# Patient Record
Sex: Female | Born: 1960
Health system: Southern US, Community
[De-identification: ages and names within clinical notes are randomized; demographics above are authoritative.]

## PROBLEM LIST (undated history)

## (undated) DIAGNOSIS — G4700 Insomnia, unspecified: Secondary | ICD-10-CM

## (undated) DIAGNOSIS — Q513 Bicornate uterus: Secondary | ICD-10-CM

## (undated) DIAGNOSIS — C4490 Unspecified malignant neoplasm of skin, unspecified: Secondary | ICD-10-CM

## (undated) DIAGNOSIS — R519 Headache, unspecified: Secondary | ICD-10-CM

## (undated) DIAGNOSIS — Z85820 Personal history of malignant melanoma of skin: Secondary | ICD-10-CM

## (undated) DIAGNOSIS — H9190 Unspecified hearing loss, unspecified ear: Secondary | ICD-10-CM

## (undated) DIAGNOSIS — F419 Anxiety disorder, unspecified: Secondary | ICD-10-CM

## (undated) DIAGNOSIS — M1990 Unspecified osteoarthritis, unspecified site: Secondary | ICD-10-CM

## (undated) DIAGNOSIS — Z803 Family history of malignant neoplasm of breast: Secondary | ICD-10-CM

## (undated) DIAGNOSIS — R51 Headache: Secondary | ICD-10-CM

## (undated) DIAGNOSIS — C439 Malignant melanoma of skin, unspecified: Secondary | ICD-10-CM

## (undated) DIAGNOSIS — D649 Anemia, unspecified: Secondary | ICD-10-CM

## (undated) HISTORY — DX: Headache: R51

## (undated) HISTORY — DX: Headache, unspecified: R51.9

## (undated) HISTORY — DX: Personal history of malignant melanoma of skin: Z85.820

## (undated) HISTORY — DX: Anemia, unspecified: D64.9

## (undated) HISTORY — DX: Family history of malignant neoplasm of breast: Z80.3

## (undated) HISTORY — DX: Unspecified malignant neoplasm of skin, unspecified: C44.90

## (undated) HISTORY — PX: RADIOFREQUENCY ABLATION: SHX2290

## (undated) HISTORY — PX: DILATION AND CURETTAGE OF UTERUS: SHX78

## (undated) HISTORY — DX: Bicornate uterus: Q51.3

## (undated) HISTORY — DX: Malignant melanoma of skin, unspecified: C43.9

## (undated) HISTORY — DX: Insomnia, unspecified: G47.00

## (undated) HISTORY — PX: SKIN BIOPSY: SHX1

---

## 2011-10-31 ENCOUNTER — Other Ambulatory Visit: Payer: Self-pay | Admitting: Obstetrics & Gynecology

## 2011-10-31 DIAGNOSIS — Z1231 Encounter for screening mammogram for malignant neoplasm of breast: Secondary | ICD-10-CM

## 2011-11-26 ENCOUNTER — Ambulatory Visit
Admission: RE | Admit: 2011-11-26 | Discharge: 2011-11-26 | Disposition: A | Discharge status: Home / Self Care | Payer: BC Managed Care – PPO | Source: Ambulatory Visit | Attending: Obstetrics & Gynecology | Admitting: Obstetrics & Gynecology

## 2011-11-26 DIAGNOSIS — Z1231 Encounter for screening mammogram for malignant neoplasm of breast: Secondary | ICD-10-CM

## 2012-10-19 ENCOUNTER — Other Ambulatory Visit: Payer: Self-pay | Admitting: Obstetrics & Gynecology

## 2012-10-19 DIAGNOSIS — Z1231 Encounter for screening mammogram for malignant neoplasm of breast: Secondary | ICD-10-CM

## 2012-11-24 ENCOUNTER — Ambulatory Visit
Admission: RE | Admit: 2012-11-24 | Discharge: 2012-11-24 | Disposition: A | Discharge status: Home / Self Care | Payer: BC Managed Care – PPO | Source: Ambulatory Visit | Attending: Obstetrics & Gynecology | Admitting: Obstetrics & Gynecology

## 2012-11-24 DIAGNOSIS — Z1231 Encounter for screening mammogram for malignant neoplasm of breast: Secondary | ICD-10-CM

## 2012-11-26 ENCOUNTER — Ambulatory Visit: Payer: BC Managed Care – PPO

## 2013-04-29 ENCOUNTER — Ambulatory Visit: Payer: Self-pay | Admitting: Obstetrics & Gynecology

## 2013-05-04 ENCOUNTER — Encounter: Payer: Self-pay | Admitting: *Deleted

## 2013-05-06 ENCOUNTER — Encounter: Payer: Self-pay | Admitting: Obstetrics & Gynecology

## 2013-05-06 ENCOUNTER — Ambulatory Visit (INDEPENDENT_AMBULATORY_CARE_PROVIDER_SITE_OTHER): Payer: BC Managed Care – PPO | Admitting: Obstetrics & Gynecology

## 2013-05-06 VITALS — BP 100/64 | HR 64 | Resp 16

## 2013-05-06 DIAGNOSIS — N9489 Other specified conditions associated with female genital organs and menstrual cycle: Secondary | ICD-10-CM

## 2013-05-06 DIAGNOSIS — IMO0002 Reserved for concepts with insufficient information to code with codable children: Secondary | ICD-10-CM

## 2013-05-06 NOTE — Progress Notes (Signed)
52 y.o. Married Caucasian female [phone] here for complaint of pelvic pain and irregular.  Using vagifem but still not very sexually active.  Lots of stress because her mother needing a lot of care.  Mother lives in [place], [place], and she fell and broke both her hip and shattered her shoulder.  She was in rehab for 25 days.  Sex is still painful.  She does feel lubricated.  She feels like there is something obstructing adequate insertion.  She does not have pain with insertions.  She has been using the vagifem faithfully and feels no improvement.  She still wonders if the fibroid is causing her the pain.  I feel like that is less likely because of location.  She does have urinary frequency and occasional urgency.  No hematuria.  Cycles are irregular as well.  She brought a calendar with her.  Bleeding in February was from 25-26 only and was light.  In March, bleeding was from 16-20 with three heavier days.  Bleeding in April was from 25-29 and again there were three heavier days.  Bleeding in may was 15-21 with several additional days of spotting.  She is not having hot flashes or night sweats.  When she has heavy bleeding, she needs to change products about every three hours.  She does have occasional clotting.    ROS:  All other ROS questions are negative except as per HPI.  Exam:   BP 100/64  Pulse 64  Resp 16  LMP 04/29/2013 General appearance: alert and cooperative Abdomen:  soft, non-tender; bowel sounds normal; no masses,  no organomegaly Lymph:  Inguinal adenopathy: negative   Pelvic: External genitalia:  no lesions              Urethra: normal appearing urethra with no masses, tenderness or lesions              Bartholins and Skenes: normal                 Vagina: normal appearing vagina with normal color and discharge, no lesions, improved moisture              Cervix: normal appearance and without lesions, No CMT              Pap taken: yes Bimanual Exam:  Uterus:  enlarged to 10  week's size with anterior nodularity, pain is present with bimanual exam but is very non-specific                               Adnexa:    normal adnexa in size, nontender and no masses                               Rectovaginal: Deferred                               Anus:  No skin changes  Discussion:  Patient and I had lengthy discussion about her physical exam and symptoms.  I really did feel perimenopausal changes were contributing when we first discussed this issue.  Patient is not the best historian at times and, at first, she did not follow my recommendations about estrogen use.  Also, I did not realize that she had been experiencing these issues for so long as she did not initially describe  them as such.  On physical exam, she does have an enlarged uterus and she does have a fibroid but fibroids usually cause bleeding issues, primarily.  She does not have pelvic floor pain.  She has common urinary issues for woman with prior pregnancies and now in her 61's.  She feels there is something obstructing adequate insertion of spouse which may be due to very anteflexed uterus and some mild prolapse.  Causes of pelvic pain and dyspareunia discussed.  Complete evaluation with cystoscopy and laparoscopy discussed.  I have a low suspicion for I.C.  Still, I think this evaluation would be helpful for her in deciding if she ultimately wants to proceed with hysterectomy.  I am unsure if hysterectomy will give her resolution in pain and I want her to be satisfied with results of any procedure we might do.  She would like to pursue cystoscopy and laparoscopy.  Knows she will need to see urology.  All questions answered.  A: Dyspareunia, pelvic pain  P: Refer to Dr. Lorayne Marek to plan cystoscopy and laparoscopy at same time to r/o Interstitial cystitis and endometriosis.  If these evaluations are negative, will consider hysterectomy.  She is not sure she wants to pursue more aggressive surgery and I have been very  clear that I cannot guarantee resolution of pain even if she has a hysterectomy.  She would like to be sexually active with her spouse so she will seriously consider our discussion.        ~30 minutes spent with patient >50% of time was in face to face discussion of above.   An After Visit Summary was printed and given to the patient.

## 2013-05-06 NOTE — Patient Instructions (Signed)
We will call with your referral.

## 2013-05-12 ENCOUNTER — Encounter: Payer: Self-pay | Admitting: Obstetrics & Gynecology

## 2013-05-12 NOTE — Addendum Note (Signed)
Addended by: Jerene Bears on: 05/12/2013 03:21 PM   Modules accepted: Orders

## 2013-05-14 ENCOUNTER — Telehealth: Payer: Self-pay | Admitting: *Deleted

## 2013-05-14 NOTE — Telephone Encounter (Signed)
Spoke with patient regarding referral process and possibility that with limited availability of provider (MacDiarmid) and he is the only provider that can offer this evaluation in this area, may have to be flexible with scheduling or be willing to wait.  Also discussed that surgery scheduling with two physicians from different practices requires coordination and time and will not likely be within the next 2-3 weeks as patient had hoped.  Both she and I have talked with two different triage nurses who will check with Dr Sherron Monday on Monday to see if she can be worked in with her limited schedule times.  Patient is requesting a Friday surgery date and to go ahead and schedule it even before she see's Dr Sherron Monday.  I advised that we can often "hold" time based on pending appointments and i will try to do this but that I do not know the availability of Dr Sherron Monday  on a Friday. Dr Hyacinth Meeker is making an exception to do surgery on a Friday but i cant promise that Friday is an option with his office.  Brief discussion of limitations following surgery discussed.  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 Patieint aware I will have limited availability on Mon and Tues next week but she is to call MacDiarmid's office at 10 on Monday to see about work in appointment and she will let me know when that is and we will try to proceed with scheduling case.

## 2013-05-14 NOTE — Telephone Encounter (Signed)
Patient called to confirm if appointment has been made for the referral to urologist. Nurse called Dr. Sherron Monday office and spoke to scheduler who stated patient had appt. For June 26th @ 2:45pm. Gave message to patient and she stated this was not a good date or time frame for her. Explained to patient that next available appt. For her with Dr. Gildardo Griffes Diarmid would be July 9th. Patient upset at this time and asked for Dr. Hyacinth Meeker or our office to call and explain she needed appt. Sooner in the time frame she wanted.

## 2013-05-18 NOTE — Telephone Encounter (Signed)
Call to pam at Dr Sherron Monday office to coordinate surgery date and check on Fri availability.  They have moved her appt up to 05-18-13 at 1000. She will call us back with date options after talking to Dr Sherron Monday.

## 2013-05-19 ENCOUNTER — Other Ambulatory Visit: Payer: Self-pay | Admitting: Urology

## 2013-05-19 NOTE — Telephone Encounter (Signed)
Calls from [facility] at dr Gildardo Griffes Diarmid and patient on two separate lines, both regarding scheduling.  Patient voiced frustration to frony desk that she was not able to speak with me immediately.  Per Pam, patient has given chice of dates and Dr Sherron Monday did agree to a Friday.  Patient has insisted to [facility] that she will be driving home after surgery and or take a cab and she stares that hospital will not know if she does this.  She also states she has done it before.  Pam has advised her of policy.  Dates from [facility] reviewed, will post case and call her back.

## 2013-05-19 NOTE — Telephone Encounter (Signed)
Please call her before calling Pam to scheduling her appointment.

## 2013-05-20 NOTE — Telephone Encounter (Signed)
Please call patient she has concerns about surgery and blood clotts

## 2013-05-21 NOTE — Telephone Encounter (Signed)
No answer and no VM

## 2013-05-21 NOTE — Telephone Encounter (Signed)
Line busy

## 2013-05-21 NOTE — Telephone Encounter (Signed)
Returning call to patient. She had questions about "clots".  She states she is letting Dr Hyacinth Meeker know that her most recent cycle has had clots that patient has previously reported she did not have. She also wants Dr Hyacinth Meeker to know that after being off Estrogen for approx 2 weeks, she does feel like bloating has improved.   Additionally, she expressed frustration regarding office policy regarding payment in full of estimated portion 2 weeks prior to procedure.  Husband is having MRI with multiple other tests on 07-09-13 and this will likely meet her total family deductible.  She will then be stuck waiting for [place] to refund her. Feels we are not listening to her and her situation and that she has never been asked to pay for procedures up front at any other practice.  Requests to speak with Dr Hyacinth Meeker directly. Feels that what she was told by administration regarding our payment policy conflicts with how dr Hyacinth Meeker dares for patients.  Advised patient that with changes in healthcare, it is our office policy, and many others as well,  to collect up front for procedures and surgery but we pride ourself in being a practice that looks at each situation. Reassured patient that we can have insurance rechecked closer to surgery date and work out a mutual payment arrangement.    Due to patient's frustration, she is aware she can call and ask for me directly.

## 2013-05-21 NOTE — Telephone Encounter (Signed)
Late note.  Spoke to patient on 05-19-13 to notify of surgery date and time.  Laparoscopy (and Cysto with HOD with Dr Sherron Monday) at 0730 on Friday, 07-23-13 at [facility] hosp. Total of 35 mins on phone to review surgical instructions, appt date and times for pre and post op appointments.  Stressed hospital policy that she must have an adult driver to take her home and to have someone with her that day.  (patient wanted to allow 54 year old son who will be licensed by then) Discussed general anesthesia and therefore no driving for 24 hours.  Small abdominal incision and anesthesia will have an effect on activity level the following day.  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 Discussed this is possible with limitations. Written surgical instructions mailed to patient.

## 2013-06-03 ENCOUNTER — Telehealth: Payer: Self-pay | Admitting: Obstetrics & Gynecology

## 2013-06-03 NOTE — Telephone Encounter (Signed)
Called patient to advise her of her OOP for her upcoming surgery. Patient insists that I need to talk to Kennon Rounds and to let [place] handle all of this because the amount I told her is meaningless. She insists that She will not be paying this much because of other procedures her husband has scheduled. I assured her that I would recheck her benefits closer to her surgery date to see if the amount she owes has decreased. The patient is very confused on how we got the amount she will owe and why I was calling her instead of Kennon Rounds. She decided that she no longer wanted to talk to me about this and asked to speak with Kennon Rounds. I put her through to [employer]

## 2013-06-29 NOTE — Telephone Encounter (Signed)
Patient is asking to talk with Kennon Rounds, no details given. Patient said she called last week and no one has called her back.

## 2013-06-30 NOTE — Telephone Encounter (Signed)
Patient notified of Dr Romine's instruction and she would like to pick up the samples.  Will come by office tomorrow.

## 2013-06-30 NOTE — Telephone Encounter (Signed)
Nothing to do about the bleeding.  For migraine, use celebrex - if we have some, you could give her three caps of the 200 mg to take if she needs it.  If not, can Rx them, 200 mg # 9 1 po q 12 hrs prn headache.

## 2013-06-30 NOTE — Telephone Encounter (Signed)
Patient calling to ask several questions of Dr Hyacinth Meeker .  1)  Reports LMP 06-10-13 and started with usual heaviness but never fully ended.  Continued to have spotting until yesterday.  Anything else to do? Husband with vasectomy. 2)  What to do for Migraines during the two weeks with no Motrin or Excedrin Migraine.  She usually needs these medications 2-3 times per week and Tylenol does not help. She will also be traveling during this time. 3)  Requested name of surgical procedure and review of OTC meds she cant take for the two weeks prior.  I answered this part myself.

## 2013-07-01 NOTE — Telephone Encounter (Signed)
Patient came to office and picked up sample , Celebrex 200mg  2 cards/ 3 tabs each/ lot number [id number] exp Dec 2016 given to patient. Instructed may take one tab Q12 hrs prn headache per Dr Tresa Res.

## 2013-07-12 ENCOUNTER — Telehealth: Payer: Self-pay | Admitting: Obstetrics & Gynecology

## 2013-07-12 NOTE — Telephone Encounter (Signed)
Call from patient who states she has a sever headache that has been unresponsive to ES Tylenol and Celebrex.  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  Took ES Tylenol at 0915 and Celebrex at 215 with no relief.  Excedrin migraine and rest is quiet room always works for her but cant take it due to surgery planned for 07-23-13.  Discussed to take Excedrin migraine (ok per CR) and will check with Dr Hyacinth Meeker on what next step should be.  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

## 2013-07-12 NOTE — Telephone Encounter (Signed)
She cannot take the excedrin migraine 6 days before surgery.  None.  Or surgery will be cancelled.  Has she ever taken any other migraine medications?  Imitrex, Relpax, etc?

## 2013-07-13 NOTE — Telephone Encounter (Signed)
It's all individual about migraine meds.  They may make her sleepy too.  Prescribe Imitrex 100mg  po x 1, repeat 2 hrs if not fully resolved.

## 2013-07-13 NOTE — Telephone Encounter (Signed)
Patient notified of dr Rondel Baton response and that she must not take the Excedrin Migraine for 6 full days preop or surgery would have to be canceled.  She has not taken any migraine medications before.  Only ever triedTylenol, Motrin and Excedrin migraine. She reports her daughter has had Imitrex prescribed in the past but was told it didn't work if wasn't taken in first 30 minutes.  Discussed that it is generally more effective if taken at onset but not necessarily that it wouldn't work.  She really does not want to cancel surgery but states she will need something to help HA during that 6 days as headaches are "debilitataing".  She is willing to try migraine med.  Would like the one that "works best".

## 2013-07-14 MED ORDER — SUMATRIPTAN SUCCINATE 100 MG PO TABS: ORAL_TABLET | ORAL | Status: DC

## 2013-07-14 NOTE — Telephone Encounter (Signed)
Yet message for you to call her back. [phone]

## 2013-07-14 NOTE — Telephone Encounter (Signed)
Rx sent th to pharmacy. Call to patient and LMTCB.

## 2013-07-14 NOTE — Telephone Encounter (Signed)
Patient returning Sally's call. °

## 2013-07-14 NOTE — Telephone Encounter (Signed)
Returning call to patient, RX has been sent. LMTCB if needed.

## 2013-07-14 NOTE — Telephone Encounter (Signed)
LM on VM that confirms patient's name, LM rx has been sent and call if questions.

## 2013-07-15 ENCOUNTER — Ambulatory Visit (INDEPENDENT_AMBULATORY_CARE_PROVIDER_SITE_OTHER): Payer: BC Managed Care – PPO | Admitting: Obstetrics & Gynecology

## 2013-07-15 ENCOUNTER — Encounter: Payer: Self-pay | Admitting: Obstetrics & Gynecology

## 2013-07-15 ENCOUNTER — Ambulatory Visit: Payer: Self-pay | Admitting: Obstetrics & Gynecology

## 2013-07-15 VITALS — BP 100/62 | HR 68 | Resp 16 | Ht 68.25 in | Wt 192.0 lb

## 2013-07-15 DIAGNOSIS — N9489 Other specified conditions associated with female genital organs and menstrual cycle: Secondary | ICD-10-CM

## 2013-07-15 DIAGNOSIS — IMO0002 Reserved for concepts with insufficient information to code with codable children: Secondary | ICD-10-CM

## 2013-07-15 MED ORDER — ALPRAZOLAM 0.5 MG PO TABS: 0.5000 mg | ORAL_TABLET | Freq: Every evening | ORAL | Status: DC | PRN

## 2013-07-15 MED ORDER — HYDROCODONE-ACETAMINOPHEN 5-325 MG PO TABS: 1.0000 | ORAL_TABLET | Freq: Four times a day (QID) | ORAL | Status: DC | PRN

## 2013-07-15 NOTE — Progress Notes (Signed)
52 y.o. [id number] MarriedCaucasian female here for discussion of upcoming procedure.  Laparoscopy and planned due to 07/23/13 due to hx of pelvic pain, dyspareunia, and to rule out IC.  Pre-op evaluation thus far has included consultation with Dr. Sherron Monday.  Patient is very anxious about this procedure.  She is afraid about dying during surgery.  Also has questions about headaches and appropriate medications she can take.  Worried that no orders have been placed for procedure.  Advised I will do that today.  Known hx of fibroids noted on prior ultrasound.  Patient is considering hysterectomy if no findings noted at time of surgery.  Procedure discussed.  Incision locations discussed.  Recovery discussed.  Risks discussed including but not limited to bleeding, rare risk of transfusion, infection, 2-3% risks of bowel/bladder/ureteral injury discussed. DVT/PE and rare risk of death discussed.  Aware I will treat any endometriosis that is present.   All questions answered.     Ob Hx:   Patient's last menstrual period was 06/07/2013.          Sexually active: yes  Birth control: vasectomy Last pap:10/17/11 ASCUS/negative HR HPV Tobacco:  Past Medical History  Diagnosis Date  . Melanoma   . Bicornate uterus   . Generalized headaches   . Anemia   . Insomnia     Past Surgical History  Procedure Laterality Date  . Cesarean section  1993  . Cesarean section  1998    Allergies: Review of patient's allergies indicates no known allergies.  Current Outpatient Prescriptions  Medication Sig Dispense Refill  . aspirin-acetaminophen-caffeine (EXCEDRIN MIGRAINE) 250-250-65 MG per tablet Take 1 tablet by mouth every 6 (six) hours as needed for pain.      . Calcium Carbonate-Vitamin D (CALCIUM 500 + D PO) Take by mouth.      . celecoxib (CELEBREX) 200 MG capsule Take 200 mg by mouth as needed for pain.      Marland Kitchen ibuprofen (ADVIL,MOTRIN) 200 MG tablet Take 200 mg by mouth every 6 (six) hours as needed for pain.       . Multiple Vitamin (MULTIVITAMIN WITH MINERALS) TABS Take 1 tablet by mouth daily.      . Estradiol (VAGIFEM) 10 MCG TABS Place vaginally 2 (two) times a week.      . SUMAtriptan (IMITREX) 100 MG tablet Take one tablet at onset of headache.  May repeat in two hours if needed.  10 tablet  0   No current facility-administered medications for this visit.    ROS: A comprehensive review of systems was negative.  Exam:    BP 100/62  Pulse 68  Resp 16  Ht 5' 8.25" (1.734 m)  Wt 192 lb (87.091 kg)  BMI 28.97 kg/m2  LMP 06/07/2013  General appearance: alert and cooperative Head: Normocephalic, without obvious abnormality, atraumatic Lungs: clear to auscultation bilaterally Heart: regular rate and rhythm, S1, S2 normal, no murmur, click, rub or gallop  A: Chronic pelvic pain, dyspareunia     P:  Diagnostic laparoscopy with cystoscopy planned.  Rx for Motrin and Norco given.  ~30 minutes spent with patient >50% of time was in face to face discussion of above.  Patient very anxious and needed much reassurance.

## 2013-07-16 ENCOUNTER — Encounter (HOSPITAL_COMMUNITY): Payer: Self-pay | Admitting: Pharmacy Technician

## 2013-07-16 ENCOUNTER — Encounter (HOSPITAL_COMMUNITY)
Admission: RE | Admit: 2013-07-16 | Discharge: 2013-07-16 | Disposition: A | Discharge status: Home / Self Care | Payer: BC Managed Care – PPO | Source: Ambulatory Visit | Attending: Obstetrics & Gynecology | Admitting: Obstetrics & Gynecology

## 2013-07-16 ENCOUNTER — Encounter (HOSPITAL_COMMUNITY): Payer: Self-pay

## 2013-07-16 ENCOUNTER — Other Ambulatory Visit (HOSPITAL_COMMUNITY): Payer: BC Managed Care – PPO

## 2013-07-16 DIAGNOSIS — Z01818 Encounter for other preprocedural examination: Secondary | ICD-10-CM | POA: Insufficient documentation

## 2013-07-16 DIAGNOSIS — Z01812 Encounter for preprocedural laboratory examination: Secondary | ICD-10-CM | POA: Insufficient documentation

## 2013-07-16 HISTORY — DX: Anxiety disorder, unspecified: F41.9

## 2013-07-16 HISTORY — DX: Unspecified hearing loss, unspecified ear: H91.90

## 2013-07-16 LAB — CBC
Hemoglobin: 13.3 g/dL (ref 12.0–15.0)
Platelets: 215 10*3/uL (ref 150–400)
RBC: 4.52 MIL/uL (ref 3.87–5.11)
WBC: 4.9 10*3/uL (ref 4.0–10.5)

## 2013-07-16 NOTE — Patient Instructions (Addendum)
   Your procedure is scheduled on: Friday, August 8  Enter through the [employer] of [facility] at: 6 am Pick up the phone at the desk and dial [phone] and inform us of your arrival.  Please call this number if you have any problems the morning of surgery: [phone]  Remember: Do not eat or drink after midnight:  Thursday Take these medicines the morning of surgery with a SIP OF WATER:  Xanax if needed.   Do not wear jewelry, make-up, or FINGER nail polish No metal in your hair or on your body. Do not wear lotions, powders, perfumes. You may wear deodorant.  Please use your CHG wash as directed prior to surgery.  Do not shave anywhere for at least 12 hours prior to first CHG shower.  Do not bring valuables to the hospital. Contacts, dentures or bridgework may not be worn into surgery.  Leave suitcase in the car. After Surgery it may be brought to your room. For patients being admitted to the hospital, checkout time is 11:00am the day of discharge.  Patients discharged on the day of surgery will not be allowed to drive home.  Home with husband Jesusita Oka or son Molli Hazard.

## 2013-07-22 MED ORDER — DEXTROSE 5 % IV SOLN
2.0000 g | INTRAVENOUS | Status: AC
  Administered 2013-07-23: 2 g via INTRAVENOUS
  Filled 2013-07-22: qty 2

## 2013-07-22 NOTE — H&P (Signed)
History of Present Illness   I was consulted by Dr. Hyacinth Meeker regarding Suzanne Hicks's complaint of dyspareunia and possibility of interstitial cystitis. For many months, she has dyspareunia which was not improved with Vagifem. Vagifem may have been causing some bloating. She normally voids every 2 hours and sometimes gets up once at night. She is generally continent. She has a good flow and usually feels empty.  She denies a history of kidney stones, previous GU surgery, and urinary tract infections.  She has no neurologic risk factors or symptoms. Her bowel function is normal.   She is planning to have a laparoscopy for possible endometriosis and/or fibroids.  There is no other modifying factors or associated signs or symptoms. There is no other aggravating or relieving factors. The symptoms are moderate in severity and persistent.    Past Medical History Problems  1. History of  Skin Cancer V10.83  Surgical History Problems  1. History of  Cesarean Section 2. History of  Dilation And Curettage  Current Meds 1. Advil TABS; Therapy: (Recorded:02Jun2014) to 2. Calcium TABS; Therapy: (Recorded:02Jun2014) to 3. Excedrin Migraine TABS; Therapy: (Recorded:02Jun2014) to 4. Multi-Vitamin TABS; Therapy: (Recorded:02Jun2014) to  Allergies Medication  1. No Known Drug Allergies  Family History Problems  1. Maternal history of  Breast Cancer V16.3 2. Family history of  Family Health Status Number Of Children one son and one daughter. 3. Maternal history of  Nephrolithiasis 4. Maternal history of  Osteoporosis V17.81 5. Paternal history of  Prostate Cancer V16.42 Denied  6. Family history of  Death In The Family Father 7. Family history of  Death In The Family Mother  Social History Problems    Alcohol Use socially   Caffeine Use two cans of diet coke per day   Marital History - Currently Married   Never A Smoker   Occupation: education administration and Veterinary surgeon Denied    History of  Tobacco Use  Review of Systems Constitutional, skin, eye, otolaryngeal, hematologic/lymphatic, cardiovascular, pulmonary, endocrine, musculoskeletal, gastrointestinal, neurological and psychiatric system(s) were reviewed and pertinent findings if present are noted.  Genitourinary: nocturia.    Vitals Vital Signs [Data Includes: Last 1 Day]  02Jun2014 10:29AM  BMI Calculated: 27.28 BSA Calculated: 1.96 Height: 5 ft 8 in Weight: 180 lb  Blood Pressure: 116 / 71, Sitting Temperature: 97.9 F, Oral Heart Rate: 80 Respiration: 16  Physical Exam Constitutional: Well nourished and well developed . No acute distress.  ENT:. The ears and nose are normal in appearance.  Neck: The appearance of the neck is normal and no neck mass is present.  Pulmonary: No respiratory distress and normal respiratory rhythm and effort.  Cardiovascular: Heart rate and rhythm are normal . No peripheral edema.  Abdomen: The abdomen is soft and nontender. No masses are palpated. No CVA tenderness. No hernias are palpable. No hepatosplenomegaly noted.  Lymphatics: The femoral and inguinal nodes are not enlarged or tender.  Skin: Normal skin turgor, no visible rash and no visible skin lesions.  Neuro/Psych:. Mood and affect are appropriate.   . Genitourinary: On pelvic examination, [name] had no levator tenderness, diverticulum, prolapse, or tenderness of the bladder.     Results/Data    Today [name] underwent a number of tests, which I personally reviewed.   Uroflowmetry: She voided 37 mL with a maximum flow of 17 mL/sec. Bladder Scan: Bladder scan residual 19 mL. Urine [Data Includes: Last 1 Day]   02Jun2014  COLOR YELLOW   APPEARANCE CLEAR  SPECIFIC GRAVITY 1.025   pH 6.0   GLUCOSE NEG mg/dL  BILIRUBIN NEG   KETONE NEG mg/dL  BLOOD NEG   PROTEIN NEG mg/dL  UROBILINOGEN 0.2 mg/dL  NITRITE NEG   LEUKOCYTE ESTERASE NEG    Assessment Assessed  1. Dyspareunia  625.0 2. Incomplete Emptying Of Bladder 788.21  Plan   Discussion/Summary   [name] has dyspareunia. Sometimes she has a feeling of incomplete bladder emptying, but generally does. She has dyspareunia but otherwise does not complain of pelvic pain. She has minimal nocturia.   She is planning to have a laparoscopy. I discussed a hydrodistension with her.  We talked about cystoscopy/hydrodistension and instillation in detail. Pros, cons, general surgical and anesthetic risks, and other options including watchful waiting were discussed. Risks were described but not limited to pain, infection, and bleeding. The risk of bladder perforation and management were discussed. The patient understands that it is primarily a diagnostic procedure.   [name] would like to proceed with a hydrodistension. We will call over to Dr. Rondel Baton office and proceed accordingly. My index of suspicion is low that she has interstitial cystitis. I think if the examination is normal, she likely does not have IC.   I am going to send a copy of my note to Dr. Hyacinth Meeker to keep her updated on her treatment course.  After a thorough review of the management options for the patient's condition the patient  elected to proceed with surgical therapy as noted above. We have discussed the potential benefits and risks of the procedure, side effects of the proposed treatment, the likelihood of the patient achieving the goals of the procedure, and any potential problems that might occur during the procedure or recuperation. Informed consent has been obtained.

## 2013-07-23 ENCOUNTER — Encounter: Payer: Self-pay | Admitting: Obstetrics & Gynecology

## 2013-07-23 ENCOUNTER — Ambulatory Visit (HOSPITAL_COMMUNITY)
Admission: RE | Admit: 2013-07-23 | Discharge: 2013-07-23 | Disposition: A | Discharge status: Home / Self Care | Payer: BC Managed Care – PPO | Source: Ambulatory Visit | Attending: Obstetrics & Gynecology | Admitting: Obstetrics & Gynecology

## 2013-07-23 ENCOUNTER — Encounter (HOSPITAL_COMMUNITY): Payer: Self-pay | Admitting: Anesthesiology

## 2013-07-23 ENCOUNTER — Encounter (HOSPITAL_COMMUNITY): Payer: Self-pay | Admitting: *Deleted

## 2013-07-23 ENCOUNTER — Ambulatory Visit (HOSPITAL_COMMUNITY): Payer: BC Managed Care – PPO | Admitting: Anesthesiology

## 2013-07-23 ENCOUNTER — Encounter (HOSPITAL_COMMUNITY)
Admission: RE | Disposition: A | Discharge status: Home / Self Care | Payer: Self-pay | Source: Ambulatory Visit | Attending: Obstetrics & Gynecology

## 2013-07-23 DIAGNOSIS — G8929 Other chronic pain: Secondary | ICD-10-CM | POA: Insufficient documentation

## 2013-07-23 DIAGNOSIS — N9489 Other specified conditions associated with female genital organs and menstrual cycle: Secondary | ICD-10-CM

## 2013-07-23 DIAGNOSIS — IMO0002 Reserved for concepts with insufficient information to code with codable children: Secondary | ICD-10-CM

## 2013-07-23 DIAGNOSIS — N949 Unspecified condition associated with female genital organs and menstrual cycle: Secondary | ICD-10-CM | POA: Insufficient documentation

## 2013-07-23 HISTORY — PX: LAPAROSCOPY: SHX197

## 2013-07-23 HISTORY — PX: CYSTOSCOPY: SHX5120

## 2013-07-23 LAB — PREGNANCY, URINE: Preg Test, Ur: NEGATIVE

## 2013-07-23 SURGERY — LAPAROSCOPY, DIAGNOSTIC
Anesthesia: General | Site: Bladder | Wound class: Clean

## 2013-07-23 MED ORDER — FENTANYL CITRATE 0.05 MG/ML IJ SOLN
INTRAMUSCULAR | Status: DC | PRN
  Administered 2013-07-23 (×2): 100 ug via INTRAVENOUS

## 2013-07-23 MED ORDER — PROPOFOL 10 MG/ML IV BOLUS
INTRAVENOUS | Status: DC | PRN
  Administered 2013-07-23: 200 mg via INTRAVENOUS

## 2013-07-23 MED ORDER — SILVER NITRATE-POT NITRATE 75-25 % EX MISC
CUTANEOUS | Status: AC
  Filled 2013-07-23: qty 1

## 2013-07-23 MED ORDER — NEOSTIGMINE METHYLSULFATE 1 MG/ML IJ SOLN
INTRAMUSCULAR | Status: DC | PRN
  Administered 2013-07-23: 3 mg via INTRAVENOUS

## 2013-07-23 MED ORDER — NEOSTIGMINE METHYLSULFATE 1 MG/ML IJ SOLN
INTRAMUSCULAR | Status: AC
  Filled 2013-07-23: qty 1

## 2013-07-23 MED ORDER — STERILE WATER FOR IRRIGATION IR SOLN
Status: DC | PRN
  Administered 2013-07-23: 300 mL

## 2013-07-23 MED ORDER — KETOROLAC TROMETHAMINE 30 MG/ML IJ SOLN
INTRAMUSCULAR | Status: AC
  Filled 2013-07-23: qty 2

## 2013-07-23 MED ORDER — FENTANYL CITRATE 0.05 MG/ML IJ SOLN
INTRAMUSCULAR | Status: AC
  Filled 2013-07-23: qty 5

## 2013-07-23 MED ORDER — MIDAZOLAM HCL 2 MG/2ML IJ SOLN
INTRAMUSCULAR | Status: AC
  Filled 2013-07-23: qty 2

## 2013-07-23 MED ORDER — DEXAMETHASONE SODIUM PHOSPHATE 10 MG/ML IJ SOLN
INTRAMUSCULAR | Status: AC
  Filled 2013-07-23: qty 1

## 2013-07-23 MED ORDER — PROPOFOL 10 MG/ML IV EMUL
INTRAVENOUS | Status: AC
  Filled 2013-07-23: qty 20

## 2013-07-23 MED ORDER — PHENAZOPYRIDINE HCL 200 MG PO TABS
ORAL | Status: DC | PRN
  Administered 2013-07-23: 09:00:00 via INTRAVESICAL

## 2013-07-23 MED ORDER — GLYCOPYRROLATE 0.2 MG/ML IJ SOLN
INTRAMUSCULAR | Status: AC
  Filled 2013-07-23: qty 3

## 2013-07-23 MED ORDER — ROCURONIUM BROMIDE 50 MG/5ML IV SOLN
INTRAVENOUS | Status: AC
  Filled 2013-07-23: qty 1

## 2013-07-23 MED ORDER — BUPIVACAINE HCL (PF) 0.5 % IJ SOLN
Freq: Once | INTRAMUSCULAR | Status: DC
  Filled 2013-07-23: qty 15

## 2013-07-23 MED ORDER — GLYCOPYRROLATE 0.2 MG/ML IJ SOLN
INTRAMUSCULAR | Status: DC | PRN
  Administered 2013-07-23: .6 mg via INTRAVENOUS

## 2013-07-23 MED ORDER — SODIUM CHLORIDE 0.9 % IJ SOLN
INTRAMUSCULAR | Status: DC | PRN
  Administered 2013-07-23: 5 mL

## 2013-07-23 MED ORDER — MIDAZOLAM HCL 5 MG/5ML IJ SOLN
INTRAMUSCULAR | Status: DC | PRN
  Administered 2013-07-23: 2 mg via INTRAVENOUS

## 2013-07-23 MED ORDER — ROCURONIUM BROMIDE 100 MG/10ML IV SOLN
INTRAVENOUS | Status: DC | PRN
  Administered 2013-07-23: 10 mg via INTRAVENOUS
  Administered 2013-07-23: 5 mg via INTRAVENOUS
  Administered 2013-07-23: 30 mg via INTRAVENOUS

## 2013-07-23 MED ORDER — LIDOCAINE HCL (CARDIAC) 20 MG/ML IV SOLN
INTRAVENOUS | Status: AC
  Filled 2013-07-23: qty 5

## 2013-07-23 MED ORDER — KETOROLAC TROMETHAMINE 30 MG/ML IJ SOLN
INTRAMUSCULAR | Status: DC | PRN
  Administered 2013-07-23: 30 mg via INTRAMUSCULAR
  Administered 2013-07-23: 30 mg via INTRAVENOUS

## 2013-07-23 MED ORDER — LACTATED RINGERS IV SOLN
INTRAVENOUS | Status: DC
  Administered 2013-07-23 (×3): via INTRAVENOUS

## 2013-07-23 MED ORDER — LIDOCAINE HCL (CARDIAC) 20 MG/ML IV SOLN
INTRAVENOUS | Status: DC | PRN
  Administered 2013-07-23: 50 mg via INTRAVENOUS

## 2013-07-23 MED ORDER — BUPIVACAINE HCL (PF) 0.25 % IJ SOLN
INTRAMUSCULAR | Status: AC
  Filled 2013-07-23: qty 30

## 2013-07-23 MED ORDER — BUPIVACAINE HCL (PF) 0.25 % IJ SOLN
INTRAMUSCULAR | Status: DC | PRN
  Administered 2013-07-23: 5 mL

## 2013-07-23 MED ORDER — BUPIVACAINE HCL (PF) 0.5 % IJ SOLN
INTRAMUSCULAR | Status: AC
  Filled 2013-07-23: qty 30

## 2013-07-23 MED ORDER — ONDANSETRON HCL 4 MG/2ML IJ SOLN
INTRAMUSCULAR | Status: AC
  Filled 2013-07-23: qty 2

## 2013-07-23 MED ORDER — ONDANSETRON HCL 4 MG/2ML IJ SOLN
INTRAMUSCULAR | Status: DC | PRN
  Administered 2013-07-23: 4 mg via INTRAVENOUS

## 2013-07-23 MED ORDER — DEXAMETHASONE SODIUM PHOSPHATE 4 MG/ML IJ SOLN
INTRAMUSCULAR | Status: DC | PRN
  Administered 2013-07-23: 8 mg via INTRAVENOUS

## 2013-07-23 SURGICAL SUPPLY — 36 items
CANISTER SUCTION 2500CC (MISCELLANEOUS) ×4 IMPLANT
CATH ROBINSON RED A/P 16FR (CATHETERS) IMPLANT
CHLORAPREP W/TINT 26ML (MISCELLANEOUS) ×4 IMPLANT
CLOTH BEACON ORANGE TIMEOUT ST (SAFETY) ×4 IMPLANT
DERMABOND ADVANCED (GAUZE/BANDAGES/DRESSINGS) ×1
DERMABOND ADVANCED .7 DNX12 (GAUZE/BANDAGES/DRESSINGS) ×3 IMPLANT
DRAPE HYSTEROSCOPY (DRAPE) IMPLANT
GLOVE BIO SURGEON STRL SZ7.5 (GLOVE) ×4 IMPLANT
GLOVE BIOGEL PI IND STRL 7.0 (GLOVE) ×3 IMPLANT
GLOVE BIOGEL PI INDICATOR 7.0 (GLOVE) ×1
GLOVE ECLIPSE 6.5 STRL STRAW (GLOVE) ×8 IMPLANT
GOWN PREVENTION PLUS LG XLONG (DISPOSABLE) ×24 IMPLANT
NDL SAFETY ECLIPSE 18X1.5 (NEEDLE) ×3 IMPLANT
NEEDLE HYPO 18GX1.5 SHARP (NEEDLE) ×1
NEEDLE INSUFFLATION 120MM (ENDOMECHANICALS) ×4 IMPLANT
NS IRRIG 1000ML POUR BTL (IV SOLUTION) ×4 IMPLANT
PACK LAPAROSCOPY BASIN (CUSTOM PROCEDURE TRAY) ×4 IMPLANT
PACK VAGINAL WOMENS (CUSTOM PROCEDURE TRAY) IMPLANT
PLUG CATH AND CAP STER (CATHETERS) IMPLANT
PROTECTOR NERVE ULNAR (MISCELLANEOUS) ×4 IMPLANT
SEALER TISSUE G2 CVD JAW 35 (ENDOMECHANICALS) IMPLANT
SEALER TISSUE G2 CVD JAW 45CM (ENDOMECHANICALS)
SET CYSTO W/LG BORE CLAMP LF (SET/KITS/TRAYS/PACK) ×4 IMPLANT
SET IRRIG TUBING LAPAROSCOPIC (IRRIGATION / IRRIGATOR) IMPLANT
SUT VIC AB 4-0 SH 27 (SUTURE)
SUT VIC AB 4-0 SH 27XANBCTRL (SUTURE) IMPLANT
SUT VICRYL 0 UR6 27IN ABS (SUTURE) IMPLANT
SUT VICRYL 4-0 PS2 18IN ABS (SUTURE) ×4 IMPLANT
SYR 20CC LL (SYRINGE) ×4 IMPLANT
TOWEL OR 17X24 6PK STRL BLUE (TOWEL DISPOSABLE) ×16 IMPLANT
TRAY FOLEY CATH 14FR (SET/KITS/TRAYS/PACK) ×4 IMPLANT
TROCAR BALLN 12MMX100 BLUNT (TROCAR) IMPLANT
TROCAR XCEL NON-BLD 11X100MML (ENDOMECHANICALS) IMPLANT
TROCAR XCEL NON-BLD 5MMX100MML (ENDOMECHANICALS) IMPLANT
WARMER LAPAROSCOPE (MISCELLANEOUS) ×4 IMPLANT
WATER STERILE IRR 1000ML POUR (IV SOLUTION) ×4 IMPLANT

## 2013-07-23 NOTE — Anesthesia Postprocedure Evaluation (Signed)
  Anesthesia Post-op Note  Patient: Suzanne Hicks  Procedure(s) Performed: Procedure(s): LAPAROSCOPY DIAGNOSTIC CYSTOSCOPY Patient is awake and responsive. Pain and nausea are reasonably well controlled. Vital signs are stable and clinically acceptable. Oxygen saturation is clinically acceptable. There are no apparent anesthetic complications at this time. Patient is ready for discharge.

## 2013-07-23 NOTE — OR Nursing (Signed)
07/23/2013 Intra-operatively Dr Sherron Monday went fist, Dr Hyacinth Meeker second.

## 2013-07-23 NOTE — Op Note (Signed)
07/23/2013  8:50 AM  PATIENT:  Suzanne Hicks  52 y.o. female  PRE-OPERATIVE DIAGNOSIS:  pelvic pain, dyspareunia CPT [phone]  POST-OPERATIVE DIAGNOSIS:  pelvic pain, thick scarring from fundus of uterus down to bladder, omental adhesions to fundus  PROCEDURE:  Procedure(s): LAPAROSCOPY DIAGNOSTIC CYSTOSCOPY  SURGEON:  Melida Northington SUZANNE  ASSISTANTS: OR staff   ANESTHESIA:   general  ESTIMATED BLOOD LOSS: 10cc  BLOOD ADMINISTERED:none   FLUIDS: 1500ccLR  UOP: 25cc clear drained with foley catheter  SPECIMEN:  none  DISPOSITION OF SPECIMEN:  N/A  FINDINGS: normal liver, gall bladder, stomach edge, no evidence of endometriosis, normal ovaries and tubes, normal cul de sac, thick/wide scarring from fundus of uterus down to bladder (most likely from prior cesarean sections)  DESCRIPTION OF OPERATION:  Patient is taken to the operating room. She is placed in the supine position. She is a running IV in place. Informed consent was present on the chart. SCDs on her lower extremities and functioning properly. General endotracheal anesthesia was administered by the anesthesia staff without difficulty.  Rodman Pickle oversaw case. Once adequate anesthesia was confirmed the legs are placed in the low lithotomy position in Cadwell stirrups. Her left arm was tucked by her side.  Chlor prep was then used to prep the abdomen and Betadine was used to prep the inner thighs, perineum and vagina. Once 3 minutes had past the patient was draped in a normal standard fashion. The legs were lifted to the high lithotomy position.  Dr. Sherron Monday was present at this time and cystoscopy was performed.  Per him, bladder was normal without evidence of interstitial cystitis.  Once he was done and cystoscopy was complete, foley catheter was inserted to straight drain.    The cervix was visualized by placing a bivalved speculum in the vagina.  The anterior lip of the cervix was grasped with single-tooth tenaculum. Os was  dilated with os finder and a Hulka clamp was inserted into the cervix as a means of manipulating the uterus during the case.  There was decreased mobility of the uterus at this point.  Legs were lowered to the low lithotomy position.    Attention was turned the abdomen.  0.25% Marcaine was used to anesthetize the skin beneath the umbilicus.  A Veress needle was obtained.   The abdomen was elevated and the needle was passed directly into the abdomen. The peritoneum was felt as a pop as it was passed with the needle. A syringe of normal saline was attached the needle and aspiration was performed. No blood or fluid was noted. Fluid was injected without difficulty and a second aspiration was performed. No fluid or blood or saline was noted. Fluid dripped easily into the needle. Low flow CO2 gas was attached the needle and the pneumoperitoneum was achieved without difficulty. Once 3.5 liters of gas was in the abdomen the Veress needle was removed and a 10-18millimeter no-bladed trocar (with Optiview) were passed directly to the abdomen. The laparoscope was then used to confirm intraperitoneal placement.   Upper and lower abdomen was surveyed.  Patient placed in trendelenburg positioning after upper abdomen photographed.  Findings significant for thick and wide adhesion from the fundus of the uterus down to the bladder.  Ovaries and tubes are freely mobile.  The upper abdomen is normal.  No evidence of endometriosis is present.  There was also omental adhesions to the fundus of the uterus.  These were more filmy and contained no small bowel.  The scarring was not  taken down as it would re-scar and patient knew she may need to have additional/different procedure to manage problems found with laparoscopy.   Procedure ended.    Pneumoperitoneum was relieved.  CRNA gave patient several good deep breaths to try and get all gas out of the abdomen.  The midline port was removed and the incision closed at the fascial level  with figure-of-eight suture of #0 Vicryl. The skin was then closed with subcuticular stitches of 3-0 Vicryl. The skin was cleansed Dermabond was applied. Attention was then turned the vagina and Hulka clamp was removed.  Minimal bleeding noted from cervical os.  A foley was removed.   Sponge, lap, needle, instrument counts were correct x2. Patient tolerated the procedure  well. She was awakened from anesthesia, extubated and taken to recovery in stable condition.    COUNTS:  YES  PLAN OF CARE: Transfer to PACU

## 2013-07-23 NOTE — Anesthesia Preprocedure Evaluation (Signed)
Anesthesia Evaluation  Patient identified by MRN, date of birth, ID band Patient awake    Reviewed: Allergy & Precautions, H&P , NPO status , Patient's Chart, lab work & pertinent test results, reviewed documented beta blocker date and time   History of Anesthesia Complications Negative for: history of anesthetic complications  Airway Mallampati: III TM Distance: >3 FB Neck ROM: full    Dental  (+) Teeth Intact   Pulmonary neg pulmonary ROS,  breath sounds clear to auscultation  Pulmonary exam normal       Cardiovascular negative cardio ROS  Rhythm:regular Rate:Normal     Neuro/Psych  Headaches, negative psych ROS   GI/Hepatic negative GI ROS, Neg liver ROS,   Endo/Other  negative endocrine ROS  Renal/GU negative Renal ROS  Female GU complaint     Musculoskeletal   Abdominal   Peds  Hematology negative hematology ROS (+)   Anesthesia Other Findings   Reproductive/Obstetrics negative OB ROS                           Anesthesia Physical Anesthesia Plan  ASA: II  Anesthesia Plan: General ETT   Post-op Pain Management:    Induction:   Airway Management Planned:   Additional Equipment:   Intra-op Plan:   Post-operative Plan:   Informed Consent: I have reviewed the patients History and Physical, chart, labs and discussed the procedure including the risks, benefits and alternatives for the proposed anesthesia with the patient or authorized representative who has indicated his/her understanding and acceptance.   Dental Advisory Given  Plan Discussed with: CRNA and Surgeon  Anesthesia Plan Comments:         Anesthesia Quick Evaluation

## 2013-07-23 NOTE — H&P (Signed)
Suzanne Hicks is an 52 y.o. female with chronic pelvic pain, dyspareunia (failed estrogen use) with known fibroids who is here for diagnostic laparoscopy and cystoscopy with Dr. Sherron Monday to evaluated for endometriosis and interstitial cystitis.  She is contemplating hysterectomy if there are no significant findings at time of surgery.  Procedure, risks and benefits have been discussed in my office and are in prior note.    Pertinent Gynecological History: Menses: flow is moderate Bleeding: regular Contraception: vasectomy DES exposure: denies Blood transfusions: none Sexually transmitted diseases: no past history Previous GYN Procedures: none  Last mammogram: normal Date: 12/13 Last pap: normal Date: 2013 OB History: G6, P2   Menstrual History: Patient's last menstrual period was 07/07/2013.    Past Medical History  Diagnosis Date  . Bicornate uterus   . Anemia   . Insomnia   . Anxiety     xanax  . Generalized headaches     imitrex/tylenol   . Melanoma     back of left leg  . Hearing loss     bilateral - has hearing aids    Past Surgical History  Procedure Laterality Date  . Cesarean section  1993  . Cesarean section  1998  . Dilation and curettage of uterus      MAB x 3    Family History  Problem Relation Age of Onset  . Breast cancer Mother   . Cancer Father     Prostate  . Cancer Paternal Grandmother     Basil cell-skin  . Stroke Paternal Grandfather     Social History:  reports that she has never smoked. She has never used smokeless tobacco. She reports that  drinks alcohol. She reports that she does not use illicit drugs.  Allergies: No Known Allergies  Prescriptions prior to admission  Medication Sig Dispense Refill  . ibuprofen (ADVIL,MOTRIN) 200 MG tablet Take 600 mg by mouth every 6 (six) hours as needed for pain.       . SUMAtriptan (IMITREX) 100 MG tablet Take one tablet at onset of headache.  May repeat in two hours if needed.  10 tablet  0  .  ALPRAZolam (XANAX) 0.5 MG tablet Take 1 tablet (0.5 mg total) by mouth at bedtime as needed for sleep.  20 tablet  0  . aspirin-acetaminophen-caffeine (EXCEDRIN MIGRAINE) 250-250-65 MG per tablet Take 1 tablet by mouth every 6 (six) hours as needed for pain.      . Calcium Carbonate-Vitamin D (CALCIUM 500 + D PO) Take 2 tablets by mouth daily.       Marland Kitchen HYDROcodone-acetaminophen (NORCO) 5-325 MG per tablet Take 1 tablet by mouth every 6 (six) hours as needed for pain.  15 tablet  0  . Multiple Vitamin (MULTIVITAMIN WITH MINERALS) TABS Take 1 tablet by mouth daily.        Review of Systems  All other systems reviewed and are negative.    Blood pressure 104/57, pulse 75, temperature 97.9 F (36.6 C), temperature source Oral, resp. rate 16, last menstrual period 07/07/2013, SpO2 99.00%. Physical Exam  Constitutional: She is oriented to person, place, and time. She appears well-developed and well-nourished.  HENT:  Head: Normocephalic and atraumatic.  Neck: Normal range of motion. Neck supple. No thyromegaly present.  Cardiovascular: Normal rate and regular rhythm.   Respiratory: Effort normal and breath sounds normal.  GI: Soft. Bowel sounds are normal.  Musculoskeletal: Normal range of motion.  Neurological: She is alert and oriented to person, place, and time.  Skin: Skin is warm and dry.  Psychiatric: She has a normal mood and affect.    No results found for this or any previous visit (from the past 24 hour(s)).  No results found.  Assessment/Plan: 52 year old with chronic pelvic pain here for laparoscopy and cystoscopy.  Questions answered.  Patient ready to proceed.    Valentina Shaggy [facility] 07/23/2013, 6:35 AM

## 2013-07-23 NOTE — Patient Instructions (Signed)
Nothing to eat or drink after midnight day before surgery.

## 2013-07-23 NOTE — Transfer of Care (Signed)
Immediate Anesthesia Transfer of Care Note  Patient: Suzanne Hicks  Procedure(s) Performed: Procedure(s): LAPAROSCOPY DIAGNOSTIC CYSTOSCOPY  Patient Location: PACU  Anesthesia Type:General  Level of Consciousness: awake, alert , oriented and patient cooperative  Airway & Oxygen Therapy: Patient Spontanous Breathing and Patient connected to nasal cannula oxygen  Post-op Assessment: Report given to PACU RN and Post -op Vital signs reviewed and stable  Post vital signs: stable  Complications: No apparent anesthesia complications

## 2013-07-23 NOTE — Op Note (Signed)
Preoperative diagnosis: Pelvic pain Postoperative diagnosis: Pelvic pain Surgery cystoscopy bladder hydrodistention Surgeon: Dr. Lorin Picket Carly Applegate  The patient has the above diagnoses and consented above procedure. She was prepped and positioned for laparoscopy. 21 [place] scope was utilized.  Bladder mucosa and trigone were normal. Is no stitch or foreign body or carcinoma. She was hydrodistended to 350 mL. Bladder was a bit spastic. Bladder was emptied  On reinspection there was no glomerulations or findings in keeping with interstitial cystitis. The patient be followed as per protocol

## 2013-07-23 NOTE — OR Nursing (Signed)
07/23/2013 @ 0730 Gold band removed with lotion in short stay, given to patients husband

## 2013-07-26 ENCOUNTER — Encounter (HOSPITAL_COMMUNITY): Payer: Self-pay | Admitting: Obstetrics & Gynecology

## 2013-08-03 ENCOUNTER — Telehealth: Payer: Self-pay | Admitting: Obstetrics & Gynecology

## 2013-08-03 NOTE — Telephone Encounter (Signed)
Patient called in to ask a quick question for Dr. Hyacinth Meeker . Needs to here from you before 1 :00 . Please

## 2013-08-03 NOTE — Telephone Encounter (Signed)
Return call to patient, she received call today from "pain clinic" which she was not expecting.  They did not have a name of the specific MD she would see.  She states Dr Hyacinth Meeker has a specific MD that she was to see at [facility] and she wants to be sure she gets scheduled with the correct MD.  Advised this is the [facility] and will check with Dr Hyacinth Meeker regarding specific MD. Patient is notified I will be out of office tomorrow and requests that someone call her and leave name of doctor and spell it.

## 2013-08-05 NOTE — Telephone Encounter (Signed)
Dr. Tempie Donning.

## 2013-08-06 NOTE — Telephone Encounter (Signed)
Patient notified of Dr Rondel Baton recommendation of Dr Viann Fish.  Patient will call back to [facility] and schedule appointment, referral info has been sent.

## 2013-08-09 ENCOUNTER — Encounter: Payer: Self-pay | Admitting: Obstetrics & Gynecology

## 2013-08-09 ENCOUNTER — Ambulatory Visit (INDEPENDENT_AMBULATORY_CARE_PROVIDER_SITE_OTHER): Payer: BC Managed Care – PPO | Admitting: Obstetrics & Gynecology

## 2013-08-09 VITALS — BP 100/78 | HR 60 | Resp 16 | Ht 68.25 in | Wt 191.2 lb

## 2013-08-09 DIAGNOSIS — N9489 Other specified conditions associated with female genital organs and menstrual cycle: Secondary | ICD-10-CM

## 2013-08-09 DIAGNOSIS — IMO0002 Reserved for concepts with insufficient information to code with codable children: Secondary | ICD-10-CM

## 2013-08-09 NOTE — Progress Notes (Signed)
Post Operative Visit/Discussion of findings  Procedure: laparoscopy/cystoscopy Days Post-op: 18 days  Subjective: Patient doing well.  Felt bloated for days after surgery.  Feeling better now.  No vaginal bleeding.  No urinary or bowel issues.  Reviewed pictures and procedure with patient.  She has many, many questions.  Answered all of these.  She has been referred to [facility] for consideration of surgery due to extensive scarring that is present from the fundus of the uterus down to the bladder.  I feel she has the best chance of a good surgical outcome with the advanced laparoscopy clinic at [facility].  She has appointment with Dr. Clarene Reamer in October.  She wants to discuss pros/cons of having ovaries removed as well as surgical risks for hysterectomy.  She, on one hand, feels this is elective and really unnecessary but on the other hand, is really considering this surgery.  Questions about time out of work also address.  She has one day in December that is the perfect day for her and she wants to be on the schedule, now.  I advised she call and see how far surgeries are being scheduled for her planning.  Objective: BP 100/78  Pulse 60  Resp 16  Ht 5' 8.25" (1.734 m)  Wt 191 lb 3.2 oz (86.728 kg)  BMI 28.84 kg/m2  LMP 07/04/2013  EXAM General: alert and cooperative GI: incision: clean, dry and intact Extremities: extremities normal, atraumatic, no cyanosis or edema Vaginal Bleeding: none  Assessment: s/p laproscopy for pelvic pain/dyspareunia, with large scar on anterior aspect of uterus down to cervix  Plan: Has been referred to [facility]   ~20 minutes spent with patient >50% of time was in face to face discussion of above.  Lengthy amount of time spent on questions regarding possible future surgery as documented above.

## 2013-09-14 ENCOUNTER — Other Ambulatory Visit: Payer: Self-pay | Admitting: Obstetrics & Gynecology

## 2013-09-15 NOTE — Telephone Encounter (Signed)
Suzanne Hicks referral for laproscopic surgery to [facility] needs records for a referral we did to them for her please?  Also checking on refill request for Imitrex refill to [facility] [address] as soon as possible by 1:00 PM.

## 2013-09-15 NOTE — Telephone Encounter (Signed)
eScribe request for refill on SUMATRIPTAN Last filled - 07/14/13, #10 X 0RF Last AEX - 10/29/12 Next AEX - 11/01/13 Please advise refills.  Paper chart on your desk.

## 2013-09-16 ENCOUNTER — Telehealth: Payer: Self-pay | Admitting: *Deleted

## 2013-09-16 MED ORDER — SUMATRIPTAN SUCCINATE 100 MG PO TABS: ORAL_TABLET | ORAL | Status: DC

## 2013-09-16 NOTE — Telephone Encounter (Signed)
Patient came to office to sign for medical records.  She is specifically requesting a disc and pictures and states this info was not sent to [facility] so she wants a copy for herself as well.  Patient records were sent to [facility] when referral was made as they have to review them before scheduling an appointment.  Call to patient, LMTCB.

## 2013-09-16 NOTE — Telephone Encounter (Signed)
i am sorry that was such a long call.  [facility] can access her records directly through care anywhere when she was there, just like we do with pt's who have appt there AND they had to review them before appt made.  rx done for imitrex with one refill.  i honestly dont know about pictures or records on disc for her.  Printed records are always better because not all offices can read discs made by particular programs  See if Bonita Quin can work on this if front doesn't know about pictures.

## 2013-09-16 NOTE — Telephone Encounter (Signed)
Patient returning call, she is insistent that she needs copy of pictures that Dr Hyacinth Meeker showed her from surgery showing all of her scar tissue and that although she was told at last OV that records had been sent, [facility] told patient on 08-24-13 that we had not sent her clinical information.  She states that surgeon in [facility] and scheduler reported they did not have records.  Patient wants records sent and a copy on disc for herself so she can be sure that records will be at her appointment on 10-12-13.  Advised patient I do not have ability to get disc, only able to print reports. Patient states that since Dr Hyacinth Meeker reviewed pictures with her, they must be part of her medical record and therefore she should be able to get them. Advised I would call [facility]ane to check on status of records, possible that if they are on EPIC, maybe records available through Care Everywhere, and then would have to have someone from front office check on ability to give disc.  Patient also questioning why only one month of Imitrex given when pharmacy called for refill.  She states her AEX is not until Nov 18 and she only gets 9 pills at a time so she will need a refill on 10-16-13.  Explained that we usually only refill till AEX due instead of for full year (unless given at AEX) and that we are often monitoring how much of this med is needed.    Patient questioning why we would need to monitor this, Isnt it safe? Advised that yes it is safe and tht sometimes if having frequent HA may need to look at other options.  After 20 minutes on call, patient is aware that: 1)  I will check with [facility] about records 2) front desk to check on pictures on disc 3) Dr Hyacinth Meeker will review call and she can advise about refill on Imitrex to get to Nov AEX. Patient will need another refill on 10-16-13.

## 2013-09-17 ENCOUNTER — Other Ambulatory Visit: Payer: Self-pay

## 2013-09-17 DIAGNOSIS — Z1231 Encounter for screening mammogram for malignant neoplasm of breast: Secondary | ICD-10-CM

## 2013-09-17 NOTE — Telephone Encounter (Signed)
Patient notified of Dr Rondel Baton information.  Patient still expressing frustration that she cant get copy of picture that Dr Hyacinth Meeker showed her that was so "clear what the problem is".  Explained Care Everywhere should allow [facility] to see anything they need and that not all computers ready all disc as dr Hyacinth Meeker explained.  Advised that Dr Hyacinth Meeker has sent Imitrex refill.

## 2013-09-28 NOTE — Telephone Encounter (Signed)
During my absence ( out of the office) patient was able to complete special request for release of records and hospital was able to provide requested information.

## 2013-10-26 ENCOUNTER — Telehealth: Payer: Self-pay | Admitting: *Deleted

## 2013-10-26 NOTE — Telephone Encounter (Signed)
Call to patient regarding appointment scheduled for 11-01-13.  Our office has schedule conflict with appointment and patient is scheduled for surgery with [facility].  Checking to see if patient wants to proceed with AEX or schedule after surgery.  Patient requests to keep AEX next week since she would like to discuss surgery plans with Dr Hyacinth Meeker.  States she still feels very undecided and needs Dr Rondel Baton input.  Appt time adjusted to 0900 om 11-01-13.  Routing to provider for final review. Patient agreeable to disposition. Will close encounter

## 2013-10-27 ENCOUNTER — Ambulatory Visit: Payer: BC Managed Care – PPO

## 2013-11-01 ENCOUNTER — Telehealth: Payer: Self-pay | Admitting: Obstetrics & Gynecology

## 2013-11-01 ENCOUNTER — Ambulatory Visit (INDEPENDENT_AMBULATORY_CARE_PROVIDER_SITE_OTHER): Payer: BC Managed Care – PPO | Admitting: Obstetrics & Gynecology

## 2013-11-01 ENCOUNTER — Encounter: Payer: Self-pay | Admitting: Obstetrics & Gynecology

## 2013-11-01 VITALS — BP 112/76 | HR 72 | Resp 16 | Ht 68.0 in | Wt 196.2 lb

## 2013-11-01 DIAGNOSIS — IMO0002 Reserved for concepts with insufficient information to code with codable children: Secondary | ICD-10-CM

## 2013-11-01 NOTE — Progress Notes (Signed)
52 y.o. [id number] MarriedCaucasianF here for annual exam.  Patient starts crying as soon as I walk in the room.  She is bleeding today and doesn't want to have an exam.  She has many questions about the surgeon she saw, why the doctor there didn't review pictures, should she do the surgery, should she have her ovaries removed.  Declines any exam today as she is bleeding and doesn't want to be undress.  Pt has a list of questions that I addressed one by one.  D/w pt why physician does not need to see pictures if operative note has good description of what was seen and I feel my operative note did very accurately describe what was seen.  Pictures are as much for documentation and for pt's to see and less for referral physicians as pictures do not always fax well.  Most records are faxed to referrral physicians.  D/w pt pros/cons of ovary removal at her age.  Literature suggests continued cardiovascular benefit up to age 8.  She is still cycling so ovary removal will put her into complete surgical menopause and the symptoms that accompany it.  No family hx of ovarian cancer.  Her mother does have breast cancer.  She does not want to be on HRT if at all possible.  Recommended she have tubes removed for cancer risk reduction but keeping ovaries at this time.  Ultimately it is her decision but that would be my current recommendation.  D/w pt surgery.  I still feel her risk is for bladder injury and this is why I referred her to [facility].  I feel this is the best place for her surgery and will minimized her operative risks.  I do recommend she have her cervix removed.  Risks for prolapse discussed.  Pt has seen an ad about morcellation and hysterectomy (from a lawyer's office) and wants to know what she should be worried about.  Discussed with pt when morcellation is indicated.  Do not feel this will be done in her case.  She is relieved by this.  All questions answered.   Exam:   BP 112/76  Pulse 72  Resp  16  Ht 5\' 8"  (1.727 m)  Wt 196 lb 3.2 oz (88.996 kg)  BMI 29.84 kg/m2  LMP 10/31/2013   Height: 5\' 8"  (172.7 cm)  Ht Readings from Last 3 Encounters:  11/01/13 5\' 8"  (1.727 m)  08/09/13 5' 8.25" (1.734 m)  07/16/13 5' 8.25" (1.734 m)    A:  Pelvic pain with significant scarring from prior Cesarean sections Dyspaurenia  P:   Pt contemplating surgery.  She will return for her annual exam next week.  ~20 minutes spent with patient >50% of time was in face to face discussion of above.

## 2013-11-01 NOTE — Patient Instructions (Signed)
Please call with any further questions/concerns.

## 2013-11-01 NOTE — Telephone Encounter (Signed)
Note not needed 

## 2013-11-02 ENCOUNTER — Telehealth: Payer: Self-pay | Admitting: Internal Medicine

## 2013-11-02 NOTE — Telephone Encounter (Signed)
Yes.  Thank you.  Can Starla let her know

## 2013-11-02 NOTE — Telephone Encounter (Signed)
1. Patient calling to check on status of Dr. Hyacinth Meeker working her in for an AEX before 12/06/13. Patient has specific dates she is available: 11/24 anything before 2 PM, 11/25 open all day, 12/3 anything before 2 PM, 12/5/ anything before 2 PM. 2. Patient also requests to be worked into the schedule for November of 2015 with Dr. Hyacinth Meeker. There are no AEX slots so we would need to use another slot type in November 2015.

## 2013-11-02 NOTE — Telephone Encounter (Signed)
Based on patient available dates, I have scheduled AEX for Friday 11-19-13 at 1145.  This is a work-in (overbook) based on what I thought would be least stress to your schedule.  Is this ok?

## 2013-11-03 ENCOUNTER — Other Ambulatory Visit: Payer: Self-pay

## 2013-11-03 ENCOUNTER — Ambulatory Visit
Admission: RE | Admit: 2013-11-03 | Discharge: 2013-11-03 | Disposition: A | Discharge status: Home / Self Care | Payer: BC Managed Care – PPO | Source: Ambulatory Visit

## 2013-11-03 DIAGNOSIS — Z1231 Encounter for screening mammogram for malignant neoplasm of breast: Secondary | ICD-10-CM

## 2013-11-03 NOTE — Telephone Encounter (Signed)
Patient notified of appointment for this year and moved up earlier in December 2015. Patient happy with this outcome.

## 2013-11-05 ENCOUNTER — Ambulatory Visit: Payer: BC Managed Care – PPO | Admitting: Obstetrics & Gynecology

## 2013-11-10 ENCOUNTER — Ambulatory Visit: Payer: BC Managed Care – PPO | Admitting: Obstetrics & Gynecology

## 2013-11-15 HISTORY — PX: LAPAROSCOPIC TOTAL HYSTERECTOMY: SUR800

## 2013-11-19 ENCOUNTER — Ambulatory Visit: Payer: BC Managed Care – PPO | Admitting: Obstetrics & Gynecology

## 2013-11-23 ENCOUNTER — Encounter: Payer: Self-pay | Admitting: Obstetrics & Gynecology

## 2013-11-23 ENCOUNTER — Ambulatory Visit (INDEPENDENT_AMBULATORY_CARE_PROVIDER_SITE_OTHER): Payer: BC Managed Care – PPO | Admitting: Obstetrics & Gynecology

## 2013-11-23 VITALS — BP 118/78 | HR 60 | Resp 16 | Ht 68.0 in | Wt 194.6 lb

## 2013-11-23 DIAGNOSIS — Z124 Encounter for screening for malignant neoplasm of cervix: Secondary | ICD-10-CM

## 2013-11-23 DIAGNOSIS — Z01419 Encounter for gynecological examination (general) (routine) without abnormal findings: Secondary | ICD-10-CM

## 2013-11-23 NOTE — Patient Instructions (Signed)

## 2013-11-23 NOTE — Progress Notes (Signed)
52 y.o. [id number] MarriedCaucasianF here for annual exam.  Still doesn't have her MMG results.  She is anxious about not getting a result.  Dr. Thea Silversmith is PCP.  Cholesterol was a little elevated.  She is sure her tetanus is overdue.  Surgery scheduled 12/06/13.  Many questions.  Answered them today.  She takes notes with everything I say, as usual.  She brought old notes and we referred back to some of them as she is confused about hysterectomy terminology.  Also, has decided not to have ovaries removed.  I support her decision in this.  Patient's last menstrual period was 10/31/2013.          Sexually active: yes  The current method of family planning is vasectomy.    Exercising: yes  and no-off and on Smoker:  no  Health Maintenance: Pap:  10/17/11 ASCUS/negative HR HPV History of abnormal Pap:  no MMG:  11/03/13  Colonoscopy:  Age 29 repeat in 5 years, in [place] BMD:   none TDaP:  Up to date Screening Labs: PCP, Hb today: PCP, Urine today: PCP   reports that she has never smoked. She has never used smokeless tobacco. She reports that she drinks alcohol. She reports that she does not use illicit drugs.  Past Medical History  Diagnosis Date  . Bicornate uterus   . Anemia   . Insomnia   . Anxiety     xanax  . Generalized headaches     imitrex/tylenol   . Melanoma     back of left leg  . Hearing loss     bilateral - has hearing aids    Past Surgical History  Procedure Laterality Date  . Cesarean section  1993  . Cesarean section  1998  . Dilation and curettage of uterus      MAB x 3  . Laparoscopy  07/23/2013    Procedure: LAPAROSCOPY DIAGNOSTIC;  Surgeon: Annamaria Boots, MD;  Location: [facility];  Service: Gynecology;;  . Cystoscopy  07/23/2013    Procedure: CYSTOSCOPY;  Surgeon: Martina Sinner, MD;  Location: [facility];  Service: Urology;;  . Skin biopsy      Current Outpatient Prescriptions  Medication Sig Dispense Refill  . aspirin-acetaminophen-caffeine  (EXCEDRIN MIGRAINE) 250-250-65 MG per tablet Take 1 tablet by mouth every 6 (six) hours as needed for pain.      . Calcium Carbonate-Vitamin D (CALCIUM 500 + D PO) Take 2 tablets by mouth daily.       Marland Kitchen ibuprofen (ADVIL,MOTRIN) 200 MG tablet Take 600 mg by mouth every 6 (six) hours as needed for pain.       . Multiple Vitamin (MULTIVITAMIN WITH MINERALS) TABS Take 1 tablet by mouth daily.      . SUMAtriptan (IMITREX) 100 MG tablet TAKE ONE TABLET AT ONSET OF HEADACHE. MAY REPEAT IN TWO HOURS IF NEEDED.  9 tablet  1   No current facility-administered medications for this visit.    Family History  Problem Relation Age of Onset  . Breast cancer Mother 58  . Cancer Father     Prostate  . Cancer Paternal Grandmother     Basil cell-skin  . Stroke Paternal Grandfather   . Breast cancer Other 57    maternal cousin-bilateral mastectomy-fast growing  . Other Mother     memory issue  . Osteoporosis Mother     ROS:  Pertinent items are noted in HPI.  Otherwise, a comprehensive ROS was negative.  Exam:   BP  118/78  Pulse 60  Resp 16  Ht 5\' 8"  (1.727 m)  Wt 194 lb 9.6 oz (88.27 kg)  BMI 29.60 kg/m2  LMP 10/31/2013  Height: 5\' 8"  (172.7 cm)  Ht Readings from Last 3 Encounters:  11/23/13 5\' 8"  (1.727 m)  11/01/13 5\' 8"  (1.727 m)  08/09/13 5' 8.25" (1.734 m)    General appearance: alert, cooperative and appears stated age Head: Normocephalic, without obvious abnormality, atraumatic Neck: no adenopathy, supple, symmetrical, trachea midline and thyroid normal to inspection and palpation Lungs: clear to auscultation bilaterally Breasts: normal appearance, no masses or tenderness Heart: regular rate and rhythm Abdomen: soft, non-tender; bowel sounds normal; no masses,  no organomegaly Extremities: extremities normal, atraumatic, no cyanosis or edema Skin: Skin color, texture, turgor normal. No rashes or lesions Lymph nodes: Cervical, supraclavicular, and axillary nodes normal. No  abnormal inguinal nodes palpated Neurologic: Grossly normal   Pelvic: External genitalia:  no lesions              Urethra:  normal appearing urethra with no masses, tenderness or lesions              Bartholins and Skenes: normal                 Vagina: normal appearing vagina with normal color and discharge, no lesions              Cervix: no lesions              Pap taken: yes Bimanual Exam:  Uterus:  normal size, contour, position, consistency, mobility, non-tender              Adnexa: normal adnexa               Rectovaginal: Confirms               Anus:  normal sphincter tone, no lesions  A:  Well Woman with normal exam Chronic pelvic pain Dyspareunia H/O bicornuate uterus H/O C section x 2 Mildly elevated cholesterol  P:   Mammogram pending pap smear obtained today Labs with PCP.  Release will be obtained. return annually or prn  An After Visit Summary was printed and given to the patient.

## 2013-11-25 LAB — IPS PAP TEST WITH REFLEX TO HPV

## 2014-09-20 ENCOUNTER — Telehealth: Payer: Self-pay | Admitting: Obstetrics & Gynecology

## 2014-09-20 DIAGNOSIS — Z1211 Encounter for screening for malignant neoplasm of colon: Secondary | ICD-10-CM

## 2014-09-20 NOTE — Telephone Encounter (Signed)
Order placed to Dr. Collene Mares.  Gabriel Cirri will handle scheduling this.  Encounter closed.

## 2014-09-20 NOTE — Telephone Encounter (Signed)
Patient requests to speak with Claiborne Billings, Dr. Ammie Ferrier CMA for assistance with the questions below:  1. Patient calling to see who Dr. Sabra Heck recommends for her colonoscopy. She wants to compare Dr. Ammie Ferrier recommendation to her PCP's recommendation.  2. Patient's husband has "MEN 1, an endocrine disorder." She wants to know who Dr. Sabra Heck recommends locally re: "He has blood in his urine that may or may not be related."

## 2014-09-20 NOTE — Telephone Encounter (Signed)
Spoke with patient-states would like to know GI Dr Sabra Heck would recommend. Also, her husband's endocrinologist at [facility] has recommended urologist in [facility] for hematuria over the past couple of years. She would like to know if Dr Sabra Heck knows of a urologist in [place] that could evaluate this with his h/o MEN 1. She is aware that this is not Dr Sanjuan Dame responsibility, but does value her opinion if she is able to give it on this subject. Please advise.//kn

## 2014-09-20 NOTE — Telephone Encounter (Signed)
GI--Dr. Collene Mares.  Does she need [place] to refer her.    Urologist for her husband--Les Border at [facility].

## 2014-09-20 NOTE — Telephone Encounter (Signed)
Patient notified of all information-would like to start the referral process to Dr Collene Mares if possible. States her PCP was supposed to give her GI names and refer at the beginning of the year, but she has not heard from them. If anything changes, she will let us know. States she appreciates all the help and information. 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

## 2014-10-17 ENCOUNTER — Encounter: Payer: Self-pay | Admitting: Obstetrics & Gynecology

## 2014-11-14 ENCOUNTER — Other Ambulatory Visit: Payer: Self-pay

## 2014-11-14 DIAGNOSIS — Z1231 Encounter for screening mammogram for malignant neoplasm of breast: Secondary | ICD-10-CM

## 2014-11-15 ENCOUNTER — Ambulatory Visit
Admission: RE | Admit: 2014-11-15 | Discharge: 2014-11-15 | Disposition: A | Discharge status: Home / Self Care | Payer: BC Managed Care – PPO | Source: Ambulatory Visit

## 2014-11-15 DIAGNOSIS — Z1231 Encounter for screening mammogram for malignant neoplasm of breast: Secondary | ICD-10-CM

## 2014-11-17 ENCOUNTER — Ambulatory Visit: Payer: BC Managed Care – PPO | Admitting: Obstetrics & Gynecology

## 2014-11-25 ENCOUNTER — Ambulatory Visit: Payer: BC Managed Care – PPO | Admitting: Obstetrics & Gynecology

## 2014-11-25 ENCOUNTER — Encounter: Payer: Self-pay | Admitting: Obstetrics & Gynecology

## 2014-11-25 ENCOUNTER — Ambulatory Visit (INDEPENDENT_AMBULATORY_CARE_PROVIDER_SITE_OTHER): Payer: BC Managed Care – PPO | Admitting: Obstetrics & Gynecology

## 2014-11-25 VITALS — BP 102/62 | HR 68 | Resp 16 | Ht 68.5 in | Wt 186.2 lb

## 2014-11-25 DIAGNOSIS — Z01419 Encounter for gynecological examination (general) (routine) without abnormal findings: Secondary | ICD-10-CM

## 2014-11-25 DIAGNOSIS — L292 Pruritus vulvae: Secondary | ICD-10-CM

## 2014-11-25 NOTE — Progress Notes (Signed)
53 y.o. [id number] MarriedCaucasianF here for annual exam.  No vaginal bleeding.  Had labs with PCP.  Cholesterol is about 40 points elevated.  Working on weight loss.       Reports having issues with vulvar itching.  Reports this feels different since her hysterectomy.  Wants me to "look".  Patient hasn't been sexually active since her surgery.  Can't report whether the pain is resolved or not.    Patient's last menstrual period was 10/31/2013.          Sexually active: Yes.    The current method of family planning is vasectomy and status post hysterectomy.    Exercising: No.  is trying to start back Smoker:  no  Health Maintenance: Pap:  11/23/13 WNL  History of abnormal Pap:  no MMG:  11/15/14 3D-normal Colonoscopy:  12/10-repeat in 5 years-scheduled with Dr Collene Mares BMD:   none TDaP:  PCP Screening Labs: PCP, Hb today: PCP, Urine today: PCP   reports that she has never smoked. She has never used smokeless tobacco. She reports that she drinks alcohol. She reports that she does not use illicit drugs.  Past Medical History  Diagnosis Date  . Bicornate uterus   . Anemia   . Insomnia   . Anxiety     xanax  . Generalized headaches     imitrex/tylenol   . Melanoma     back of left leg  . Hearing loss     bilateral - has hearing aids    Past Surgical History  Procedure Laterality Date  . Cesarean section  1993  . Cesarean section  1998  . Dilation and curettage of uterus      MAB x 3  . Laparoscopy  07/23/2013    Procedure: LAPAROSCOPY DIAGNOSTIC;  Surgeon: Lyman Speller, MD;  Location: [facility];  Service: Gynecology;;  . Cystoscopy  07/23/2013    Procedure: CYSTOSCOPY;  Surgeon: Reece Packer, MD;  Location: [facility];  Service: Urology;;  . Skin biopsy      Current Outpatient Prescriptions  Medication Sig Dispense Refill  . aspirin-acetaminophen-caffeine (EXCEDRIN MIGRAINE) 250-250-65 MG per tablet Take 1 tablet by mouth every 6 (six) hours as needed for pain.    . Calcium  Carbonate-Vitamin D (CALCIUM 500 + D PO) Take 2 tablets by mouth daily.     Marland Kitchen ibuprofen (ADVIL,MOTRIN) 200 MG tablet Take 600 mg by mouth every 6 (six) hours as needed for pain.     . Multiple Vitamin (MULTIVITAMIN WITH MINERALS) TABS Take 1 tablet by mouth daily.    . SUMAtriptan (IMITREX) 100 MG tablet TAKE ONE TABLET AT ONSET OF HEADACHE. MAY REPEAT IN TWO HOURS IF NEEDED. 9 tablet 1   No current facility-administered medications for this visit.    Family History  Problem Relation Age of Onset  . Breast cancer Mother 64  . Cancer Father     Prostate  . Cancer Paternal Grandmother     Basil cell-skin  . Stroke Paternal Grandfather   . Breast cancer Other 57    maternal cousin-bilateral mastectomy-fast growing  . Other Mother     memory issue  . Osteoporosis Mother     ROS:  Pertinent items are noted in HPI.  Otherwise, a comprehensive ROS was negative.  Exam:   BP 102/62 mmHg  Pulse 68  Resp 16  Ht 5' 8.5" (1.74 m)  Wt 186 lb 3.2 oz (84.46 kg)  BMI 27.90 kg/m2  LMP 10/31/2013  Weight change: -  8#  Height: 5' 8.5" (174 cm)  Ht Readings from Last 3 Encounters:  11/25/14 5' 8.5" (1.74 m)  11/23/13 5\' 8"  (1.727 m)  11/01/13 5\' 8"  (1.727 m)    General appearance: alert, cooperative and appears stated age Head: Normocephalic, without obvious abnormality, atraumatic Neck: no adenopathy, supple, symmetrical, trachea midline and thyroid normal to inspection and palpation Lungs: clear to auscultation bilaterally Breasts: normal appearance, no masses or tenderness Heart: regular rate and rhythm Abdomen: soft, non-tender; bowel sounds normal; no masses,  no organomegaly Extremities: extremities normal, atraumatic, no cyanosis or edema Skin: Skin color, texture, turgor normal. No rashes or lesions Lymph nodes: Cervical, supraclavicular, and axillary nodes normal. No abnormal inguinal nodes palpated Neurologic: Grossly normal   Pelvic: External genitalia:  no lesions               Urethra:  normal appearing urethra with no masses, tenderness or lesions              Bartholins and Skenes: normal                 Vagina: normal appearing vagina with normal color and discharge, no lesions              Cervix: absent              Pap taken: No. Bimanual Exam:  Uterus:  uterus absent              Adnexa: normal adnexa and no mass, fullness, tenderness               Rectovaginal: Confirms               Anus:  normal sphincter tone, no lesions  A:  Well Woman with normal exam Chronic pelvic pain S/p TLH, laparoscopic with bilateral salpingectomy Dyspareunia H/O bicornuate uterus H/O C section x 2 Elevated lipids Vulvar itching.  No skin changes noted.  P: Mammogram yearly pap smear not indicated Labs with PCP Topical hydrocortisone discussed.  Affirm test obtained.  Changing topical products discussed with pt.  An After Visit Summary was printed and given to the patient.

## 2014-11-26 LAB — WET PREP BY MOLECULAR PROBE
CANDIDA SPECIES: NEGATIVE
Gardnerella vaginalis: POSITIVE — AB
Trichomonas vaginosis: NEGATIVE

## 2014-11-28 NOTE — Addendum Note (Signed)
Addended by: Megan Salon on: 11/28/2014 07:04 AM   Modules accepted: Miquel Dunn

## 2014-11-29 ENCOUNTER — Other Ambulatory Visit: Payer: Self-pay

## 2014-11-29 MED ORDER — METRONIDAZOLE 500 MG PO TABS: 500.0000 mg | ORAL_TABLET | Freq: Two times a day (BID) | ORAL | Status: DC

## 2014-11-29 NOTE — Telephone Encounter (Signed)
Patient notified-would rather take the pills. Rx pending//kn

## 2014-11-29 NOTE — Telephone Encounter (Signed)
-----   Message from Lyman Speller, MD sent at 11/28/2014  7:05 AM EST ----- Please inform BV present.  Treat either either with Flagyl 500mg  bid x 7 days or Metrogel 0.75% qhs x 5 days.  Either is fine. I will cosign order.  If she has extensive questions, please see if Gay Filler will call.

## 2014-12-02 ENCOUNTER — Ambulatory Visit: Payer: BC Managed Care – PPO | Admitting: Obstetrics & Gynecology

## 2014-12-13 ENCOUNTER — Telehealth: Payer: Self-pay | Admitting: Obstetrics & Gynecology

## 2014-12-13 NOTE — Telephone Encounter (Signed)
Left message to call Temitope Flammer at [phone]. 

## 2014-12-13 NOTE — Telephone Encounter (Signed)
Patient has some questions about her medication she was "taking for seven days." She is not sure of the name of the medication. Patient requested to speak with Claiborne Billings but is aware a triage nurse will call her back.

## 2014-12-13 NOTE — Telephone Encounter (Signed)
Noted in chart.  Encounter closed.  Thank you.

## 2014-12-13 NOTE — Telephone Encounter (Signed)
Spoke with patient. Patient states that she has been taking Flagyl for bacterial infection. During last two days of treatment she began to notice "red blotches and spots on my neck." Denies any itching, swelling, pain, shortness of breath, etc. "I had no other symptoms other than the blotches on my neck. I completed the medication and they have gone away. I just wasn't sure if this was a reaction to the medication or not." Advised patient would let Dr.Miller know so this could be noted in chart. Advised this may have been a reaction to the medication and may need to make changes to medication in the future if Dr.Miller recommends. Patient is agreeable. "I am fine I just wanted to let you guys know."

## 2014-12-13 NOTE — Telephone Encounter (Signed)
Don't think I routed this to you but noted in chart and encounter closed. Thanks.

## 2015-05-05 ENCOUNTER — Other Ambulatory Visit: Payer: Self-pay | Admitting: Internal Medicine

## 2015-05-05 DIAGNOSIS — N6320 Unspecified lump in the left breast, unspecified quadrant: Secondary | ICD-10-CM

## 2015-05-08 ENCOUNTER — Telehealth: Payer: Self-pay | Admitting: Obstetrics & Gynecology

## 2015-05-08 NOTE — Telephone Encounter (Signed)
Left message to call Yale Golla at [phone]. 

## 2015-05-08 NOTE — Telephone Encounter (Signed)
Pt left voicemail requesting call from Dr Ammie Ferrier nurse

## 2015-05-08 NOTE — Telephone Encounter (Signed)
Spoke with patient. Patient states she received a call from me after our previous phone call. Advised I was returning call from a message stating she had called in to the office. Patient became frustrated stating she had not called and I was the one who called her. Apologized stating it may have been a voicemail from earlier or my return call to a message I received as I thought she had called back. Patient states I do not understand what she is saying as my call came in while she was on another line and she had not called the office. Again I apologized for the confusion and advised I have sent a message over to [place] regarding her breast imaging and our office will return call with any further recommendations after results have been reviewed if anything additional is recommended. Advised to return call to office with any further question. Patient is agreeable.

## 2015-05-08 NOTE — Telephone Encounter (Signed)
Left message to call Kaitlyn at [phone]. 

## 2015-05-08 NOTE — Telephone Encounter (Signed)
Returning a call to [place].

## 2015-05-08 NOTE — Telephone Encounter (Signed)
Spoke with patient. Patient states that she was seen with her internist after she had a fall and hurt her rib. At appointment patient had a breast check and was advised will need to have left breat diagnostic mammogram and ultrasound. "He said he found a lump but he barely even did an exam. It just seems odd that I went in to have my rib area checked and now I am having this imaging. I would not say he is a breast expert." Advised patient if MD performed breast exam and feels additional imaging is needed it is important to have this imaging performed to rule out any problems with the breast. Offered to schedule breast check with Dr.Miller but patient declines. "It just seems extreme." Advised best to have imaging performed to rule out any areas of concern. Patient is agreeable. Appointment is schedule with [facility] for tomorrow at 9:40am. Advised I will let Dr.Miller know so that results may be reviewed. Patient is agreeable and will call with any further questions or concerns before or after imaging.  Routing to [place] as Juluis Rainier.

## 2015-05-09 ENCOUNTER — Ambulatory Visit
Admission: RE | Admit: 2015-05-09 | Discharge: 2015-05-09 | Disposition: A | Discharge status: Home / Self Care | Payer: BLUE CROSS/BLUE SHIELD | Source: Ambulatory Visit | Attending: Internal Medicine | Admitting: Internal Medicine

## 2015-05-09 ENCOUNTER — Other Ambulatory Visit: Payer: Self-pay

## 2015-05-09 DIAGNOSIS — N6320 Unspecified lump in the left breast, unspecified quadrant: Secondary | ICD-10-CM

## 2015-05-09 NOTE — Telephone Encounter (Signed)
Ok to close encounter.  Thank you for the information.

## 2015-10-20 ENCOUNTER — Encounter: Payer: Self-pay | Admitting: Obstetrics & Gynecology

## 2015-10-20 ENCOUNTER — Ambulatory Visit (INDEPENDENT_AMBULATORY_CARE_PROVIDER_SITE_OTHER): Payer: BLUE CROSS/BLUE SHIELD | Admitting: Obstetrics & Gynecology

## 2015-10-20 VITALS — BP 100/68 | HR 64 | Resp 16 | Ht 68.0 in | Wt 131.0 lb

## 2015-10-20 DIAGNOSIS — H9193 Unspecified hearing loss, bilateral: Secondary | ICD-10-CM

## 2015-10-20 DIAGNOSIS — Z01419 Encounter for gynecological examination (general) (routine) without abnormal findings: Secondary | ICD-10-CM

## 2015-10-20 DIAGNOSIS — H9190 Unspecified hearing loss, unspecified ear: Secondary | ICD-10-CM | POA: Insufficient documentation

## 2015-10-20 NOTE — Patient Instructions (Addendum)
[place]:  Vivien Rossetti ([place]) FP Colin [place]) ** [place], internal medicine  [place]:  Mayra Neer  replens vaginal moisturizer.  One applicator full twice weekly, vaginally.  You should a lubricant with this.  Suggestions for lubricants:  Astroglide, slippery stuff.

## 2015-10-20 NOTE — Progress Notes (Signed)
54 y.o. [id number] MarriedCaucasianF here for annual exam.  Very busy.  Son is now at [employer] [place].  He is doing a Leisure centre manager double degree.  Very proud of him.  Lost about 60 pounds in the past year.  Did Take [employer] program.    No vaginal bleeding. Still with vaginal dryness with intercourse.  Not very SA.  Would like some recommendations.  Uncomfortable with using any estrogen products.  Denies hot flashes or night sweats.  Having some increased hearing loss.  Would like suggestion for audiologist to see.    PCP: [facility].  Has appt 12/16.  Would like suggestions for new PCPs.  Names given.  Patient's last menstrual period was 10/31/2013.          Sexually active: Yes.    The current method of family planning is vasectomy and status post hysterectomy.    Exercising: Yes.    swim 2-3 x weekly Smoker:  no  Health Maintenance: Pap:  11/23/13 WNL  History of abnormal Pap:  yes MMG:  11/15/14 3D-BiRads 1-negative, 05/09/15 right MMG/US-BiRads 1-negative screening in 7 months Colonoscopy:  12/15-repeat in 5 years BMD:   none TDaP:  UTD with PCP Screening Labs: PCP, Hb today: PCP, Urine today: PCP   reports that she has never smoked. She has never used smokeless tobacco. She reports that she drinks alcohol. She reports that she does not use illicit drugs.  Past Medical History  Diagnosis Date  . Bicornate uterus   . Anemia   . Insomnia   . Anxiety     xanax  . Generalized headaches     imitrex/tylenol   . Melanoma ([place])     back of left leg  . Hearing loss     bilateral - has hearing aids    Past Surgical History  Procedure Laterality Date  . Cesarean section  1993  . Cesarean section  1998  . Dilation and curettage of uterus      MAB x 3  . Laparoscopy  07/23/2013    Procedure: LAPAROSCOPY DIAGNOSTIC;  Surgeon: Lyman Speller, MD;  Location: [facility];  Service: Gynecology;;  . Cystoscopy  07/23/2013    Procedure: CYSTOSCOPY;  Surgeon: Reece Packer, MD;  Location: [facility];  Service: Urology;;  . Skin biopsy    . Laparoscopic total hysterectomy  12/14    at [facility]    Current Outpatient Prescriptions  Medication Sig Dispense Refill  . aspirin-acetaminophen-caffeine (EXCEDRIN MIGRAINE) 250-250-65 MG per tablet Take 1 tablet by mouth every 6 (six) hours as needed for pain.    . Calcium Carbonate-Vitamin D (CALCIUM 500 + D PO) Take 2 tablets by mouth daily.     Marland Kitchen ibuprofen (ADVIL,MOTRIN) 200 MG tablet Take 600 mg by mouth every 6 (six) hours as needed for pain.     . JUBLIA 10 % SOLN     . Multiple Vitamin (MULTIVITAMIN WITH MINERALS) TABS Take 1 tablet by mouth daily.    . SUMAtriptan (IMITREX) 100 MG tablet TAKE ONE TABLET AT ONSET OF HEADACHE. MAY REPEAT IN TWO HOURS IF NEEDED. 9 tablet 1   No current facility-administered medications for this visit.    Family History  Problem Relation Age of Onset  . Breast cancer Mother 24  . Cancer Father     Prostate  . Cancer Paternal Grandmother     Basil cell-skin  . Stroke Paternal Grandfather   . Breast cancer Other 53  maternal cousin-bilateral mastectomy-fast growing  . Other Mother     memory issue  . Osteoporosis Mother     ROS:  Pertinent items are noted in HPI.  Otherwise, a comprehensive ROS was negative.  Exam:   Wt: -63# (purposeful weight loss) General appearance: alert, cooperative and appears stated age Head: Normocephalic, without obvious abnormality, atraumatic Neck: no adenopathy, supple, symmetrical, trachea midline and thyroid normal to inspection and palpation Lungs: clear to auscultation bilaterally Breasts: normal appearance, no masses or tenderness Heart: regular rate and rhythm Abdomen: soft, non-tender; bowel sounds normal; no masses,  no organomegaly Extremities: extremities normal, atraumatic, no cyanosis or edema Skin: Skin color, texture, turgor normal. No rashes or lesions Lymph nodes: Cervical, supraclavicular, and axillary nodes  normal. No abnormal inguinal nodes palpated Neurologic: Grossly normal   Pelvic: External genitalia:  no lesions              Urethra:  normal appearing urethra with no masses, tenderness or lesions              Bartholins and Skenes: normal                 Vagina: normal appearing vagina with normal color and discharge, no lesions              Cervix: absent              Pap taken: No. Bimanual Exam:  Uterus:  uterus absent              Adnexa: no mass, fullness, tenderness               Rectovaginal: Confirms               Anus:  normal sphincter tone, no lesions  Chaperone was present for exam.  A:  Well Woman with normal exam H/O TLH/bilateral salpingectomy/cysto with h/o adhesions due to prior cesarean section and bicornuate uterus Vaginal dryness H/o Melanoma Tdap UTD  P: Mammogram recommendations discussed pap smear guidelines discussed.  No pap today. Labs with PCP. Has appt 12/16.  Declines labs today. Seeing Dr. Ubaldo Glassing every six months Will research audiologist to see if can get pt with a provider who knows her hearing aids well.   Return annually or prn

## 2015-11-22 ENCOUNTER — Other Ambulatory Visit: Payer: Self-pay

## 2015-11-22 DIAGNOSIS — Z1231 Encounter for screening mammogram for malignant neoplasm of breast: Secondary | ICD-10-CM

## 2015-11-29 ENCOUNTER — Ambulatory Visit
Admission: RE | Admit: 2015-11-29 | Discharge: 2015-11-29 | Disposition: A | Discharge status: Home / Self Care | Payer: BLUE CROSS/BLUE SHIELD | Source: Ambulatory Visit

## 2015-11-29 DIAGNOSIS — Z1231 Encounter for screening mammogram for malignant neoplasm of breast: Secondary | ICD-10-CM

## 2016-08-16 DIAGNOSIS — H903 Sensorineural hearing loss, bilateral: Secondary | ICD-10-CM | POA: Insufficient documentation

## 2016-10-23 ENCOUNTER — Other Ambulatory Visit: Payer: Self-pay | Admitting: Obstetrics & Gynecology

## 2016-10-23 DIAGNOSIS — Z1231 Encounter for screening mammogram for malignant neoplasm of breast: Secondary | ICD-10-CM

## 2016-10-25 ENCOUNTER — Ambulatory Visit: Payer: Self-pay | Admitting: Obstetrics & Gynecology

## 2016-10-25 ENCOUNTER — Telehealth: Payer: Self-pay | Admitting: Obstetrics & Gynecology

## 2016-10-25 NOTE — Telephone Encounter (Signed)
Patient calling to speak with Gay Filler about an appointment. Says Gay Filler called her late Thursday evening. Please call her on cell number [phone].

## 2016-10-25 NOTE — Telephone Encounter (Signed)
Return call to patient. Doctor canceled/rescheduled appointment for this afternoon. Patient has now realized rescheduled appointment will not work for her and she needs a different time but still needs before end of November. Appointment rescheduled to 11-11-16 at 330.  Routing to provider for final review. Patient agreeable to disposition. Will close encounter.

## 2016-11-11 ENCOUNTER — Ambulatory Visit (INDEPENDENT_AMBULATORY_CARE_PROVIDER_SITE_OTHER): Payer: BLUE CROSS/BLUE SHIELD | Admitting: Obstetrics & Gynecology

## 2016-11-11 ENCOUNTER — Encounter: Payer: Self-pay | Admitting: Obstetrics & Gynecology

## 2016-11-11 VITALS — BP 98/60 | HR 68 | Resp 12 | Ht 68.0 in | Wt 140.0 lb

## 2016-11-11 DIAGNOSIS — Z124 Encounter for screening for malignant neoplasm of cervix: Secondary | ICD-10-CM | POA: Diagnosis not present

## 2016-11-11 DIAGNOSIS — Z01419 Encounter for gynecological examination (general) (routine) without abnormal findings: Secondary | ICD-10-CM

## 2016-11-11 MED ORDER — SUMATRIPTAN SUCCINATE 100 MG PO TABS: ORAL_TABLET | ORAL | 5 refills | Status: DC

## 2016-11-11 NOTE — Patient Instructions (Addendum)
I'd like you to have a Hepatitis C test done and a Vit D test done when you have your blood work.  Consider doing an [facility].  Replens vaginal moisturizer.

## 2016-11-11 NOTE — Progress Notes (Signed)
55 y.o. [id number] MarriedCaucasianF here for annual exam.  Denies vaginal bleeding.  Still swimming regularly.  Has gained about 9 pounds of weight since last year that I am very pleased about with her.    Son is at [employer] [place].  He is doing very well.    Seeing audiologist at [facility].  She has seen a specialist at [facility] regarding cochlear implant.  She did have an MRI.  She is not sure she wants to do this.  She has thought about having a second opinion.    Has new appt with Dr. Harlan Stains.    Patient's last menstrual period was 10/31/2013.          Sexually active: Yes.    The current method of family planning is status post hysterectomy.    Exercising: Yes.    swimming Smoker:  no  Health Maintenance: Pap:  11/23/13 negative  History of abnormal Pap:  yes MMG:  11/29/15 BIRADS 1 negative  Colonoscopy:  12/15- repeat 5 years BMD:   None  TDaP:  06/14/10  Pneumonia vaccine(s):  never Zostavax:   Never  Hep C testing: discuss with provider Screening Labs: discuss with provider, Hb today: discuss with provider, Urine today: discuss with provider   reports that she has never smoked. She has never used smokeless tobacco. She reports that she drinks alcohol. She reports that she does not use drugs.  Past Medical History:  Diagnosis Date  . Anemia   . Anxiety    xanax  . Bicornate uterus   . Generalized headaches    imitrex/tylenol   . Hearing loss    bilateral - has hearing aids  . Insomnia   . Melanoma ([place])    back of left leg    Past Surgical History:  Procedure Laterality Date  . CESAREAN SECTION  1993  . CESAREAN SECTION  1998  . CYSTOSCOPY  07/23/2013   Procedure: CYSTOSCOPY;  Surgeon: Reece Packer, MD;  Location: [facility];  Service: Urology;;  . Brigitte Pulse AND CURETTAGE OF UTERUS     MAB x 3  . LAPAROSCOPIC TOTAL HYSTERECTOMY  12/14   at [facility]  . LAPAROSCOPY  07/23/2013   Procedure: LAPAROSCOPY DIAGNOSTIC;  Surgeon: Lyman Speller, MD;   Location: [facility];  Service: Gynecology;;  . SKIN BIOPSY      Current Outpatient Prescriptions  Medication Sig Dispense Refill  . aspirin-acetaminophen-caffeine (EXCEDRIN MIGRAINE) 250-250-65 MG per tablet Take 1 tablet by mouth every 6 (six) hours as needed for pain.    . Calcium Carbonate-Vitamin D (CALCIUM 500 + D PO) Take 1 tablet by mouth daily.     Marland Kitchen ibuprofen (ADVIL,MOTRIN) 200 MG tablet Take 600 mg by mouth every 6 (six) hours as needed for pain.     . Multiple Vitamin (MULTIVITAMIN WITH MINERALS) TABS Take 1 tablet by mouth daily.    . SUMAtriptan (IMITREX) 100 MG tablet TAKE ONE TABLET AT ONSET OF HEADACHE. MAY REPEAT IN TWO HOURS IF NEEDED. 9 tablet 1   No current facility-administered medications for this visit.     Family History  Problem Relation Age of Onset  . Breast cancer Mother 75  . Cancer Father     Prostate  . Cancer Paternal Grandmother     Basil cell-skin  . Stroke Paternal Grandfather   . Breast cancer Other 57    maternal cousin-bilateral mastectomy-fast growing  . Other Mother     memory issue  . Osteoporosis Mother  ROS:  Pertinent items are noted in HPI.  Otherwise, a comprehensive ROS was negative.  Exam:   BP 98/60 (BP Location: Right Arm, Patient Position: Sitting, Cuff Size: Normal)   Pulse 68   Resp 12   Ht 5\' 8"  (1.727 m)   Wt 140 lb (63.5 kg)   LMP 10/31/2013   BMI 21.29 kg/m   Weight change: +9#   Height: 5\' 8"  (172.7 cm)  Ht Readings from Last 3 Encounters:  11/11/16 5\' 8"  (1.727 m)  10/20/15 5\' 8"  (1.727 m)  11/25/14 5' 8.5" (1.74 m)    General appearance: alert, cooperative and appears stated age Head: Normocephalic, without obvious abnormality, atraumatic Neck: no adenopathy, supple, symmetrical, trachea midline and thyroid normal to inspection and palpation Lungs: clear to auscultation bilaterally Breasts: normal appearance, no masses or tenderness Heart: regular rate and rhythm Abdomen: soft, non-tender; bowel sounds  normal; no masses,  no organomegaly Extremities: extremities normal, atraumatic, no cyanosis or edema Skin: Skin color, texture, turgor normal. No rashes or lesions Lymph nodes: Cervical, supraclavicular, and axillary nodes normal. No abnormal inguinal nodes palpated Neurologic: Grossly normal  Pelvic: External genitalia:  no lesions              Urethra:  normal appearing urethra with no masses, tenderness or lesions              Bartholins and Skenes: normal                 Vagina: normal appearing vagina with normal color and discharge, no lesions              Cervix: absent              Pap taken: No. Bimanual Exam:  Uterus:  uterus absent              Adnexa: no mass, fullness, tenderness               Rectovaginal: Confirms               Anus:  normal sphincter tone, no lesions  Chaperone was present for exam.  A:  Well woman exam H/O TLH/bilateral salpingectomy/cystoscopy with h/o adhesions due to prior cesarean section and bicornuate uterus  Vaginal dryness H/o melanoma--followed by Dr. Ubaldo Glassing every six months  P: Mammogram recommendations discussed pap smear guidelines discussed.  Pt desires a Pap smear this year. Seeing Dr. Dema Severin as new PCP next week.  Will do blood work done then.  Suggested she have Hep C testing, Vit D testing done. Return annually or prn

## 2016-11-13 LAB — IPS PAP SMEAR ONLY

## 2016-11-14 ENCOUNTER — Ambulatory Visit: Payer: Self-pay | Admitting: Obstetrics & Gynecology

## 2016-11-29 ENCOUNTER — Ambulatory Visit
Admission: RE | Admit: 2016-11-29 | Discharge: 2016-11-29 | Disposition: A | Discharge status: Home / Self Care | Payer: BLUE CROSS/BLUE SHIELD | Source: Ambulatory Visit | Attending: Obstetrics & Gynecology | Admitting: Obstetrics & Gynecology

## 2016-11-29 ENCOUNTER — Ambulatory Visit: Payer: BLUE CROSS/BLUE SHIELD

## 2016-11-29 DIAGNOSIS — Z1231 Encounter for screening mammogram for malignant neoplasm of breast: Secondary | ICD-10-CM

## 2017-10-21 DIAGNOSIS — Z0000 Encounter for general adult medical examination without abnormal findings: Secondary | ICD-10-CM | POA: Diagnosis not present

## 2017-10-22 DIAGNOSIS — Z23 Encounter for immunization: Secondary | ICD-10-CM | POA: Diagnosis not present

## 2017-10-22 DIAGNOSIS — M722 Plantar fascial fibromatosis: Secondary | ICD-10-CM | POA: Diagnosis not present

## 2017-10-22 DIAGNOSIS — F439 Reaction to severe stress, unspecified: Secondary | ICD-10-CM | POA: Diagnosis not present

## 2017-10-22 DIAGNOSIS — Z0000 Encounter for general adult medical examination without abnormal findings: Secondary | ICD-10-CM | POA: Diagnosis not present

## 2017-10-22 DIAGNOSIS — Z13220 Encounter for screening for lipoid disorders: Secondary | ICD-10-CM | POA: Diagnosis not present

## 2017-10-22 DIAGNOSIS — R51 Headache: Secondary | ICD-10-CM | POA: Diagnosis not present

## 2017-10-24 ENCOUNTER — Other Ambulatory Visit: Payer: Self-pay | Admitting: Obstetrics & Gynecology

## 2017-10-24 ENCOUNTER — Ambulatory Visit (INDEPENDENT_AMBULATORY_CARE_PROVIDER_SITE_OTHER): Payer: BLUE CROSS/BLUE SHIELD | Admitting: Obstetrics & Gynecology

## 2017-10-24 ENCOUNTER — Encounter: Payer: Self-pay | Admitting: Obstetrics & Gynecology

## 2017-10-24 ENCOUNTER — Other Ambulatory Visit: Payer: Self-pay

## 2017-10-24 VITALS — BP 94/60 | HR 68 | Resp 16 | Ht 68.0 in | Wt 140.0 lb

## 2017-10-24 DIAGNOSIS — Z01419 Encounter for gynecological examination (general) (routine) without abnormal findings: Secondary | ICD-10-CM

## 2017-10-24 DIAGNOSIS — Z1231 Encounter for screening mammogram for malignant neoplasm of breast: Secondary | ICD-10-CM

## 2017-10-24 MED ORDER — SUMATRIPTAN SUCCINATE 100 MG PO TABS: ORAL_TABLET | ORAL | 12 refills | Status: DC

## 2017-10-24 MED ORDER — NONFORMULARY OR COMPOUNDED ITEM
4 refills | Status: DC
Start: 2017-10-24 — End: 2018-09-11

## 2017-10-24 NOTE — Progress Notes (Signed)
56 y.o. [id number] MarriedCaucasianF here for annual exam.  Doing well.  Still swimming really regularly.  Exercising 2-4 days a week.  Denies vaginal bleeding.  Having intercourse more this past year.  Has used some OTC products.  Not interested in estrogens.  Would be open to suggestions.    Sees dermatologist every six months.  Sees Dr. Ubaldo Glassing.  Patient's last menstrual period was 10/31/2013.          Sexually active: Yes.    The current method of family planning is status post hysterectomy.    Exercising: Yes.    swimming Smoker:  no  Health Maintenance: Pap:  11/11/16 Neg   11/23/13 Neg  History of abnormal Pap:  yes MMG:  11/29/16 BIRADS1:Neg  Colonoscopy:  12/05/14 normal f/u 5 years  BMD:   Never TDaP:  05/2010 Pneumonia vaccine(s):  No Zostavax:   No Hep C testing: unsure Screening Labs: PCP   reports that  has never smoked. she has never used smokeless tobacco. She reports that she drinks alcohol. She reports that she does not use drugs.  Past Medical History:  Diagnosis Date  . Anemia   . Anxiety    xanax  . Bicornate uterus   . Generalized headaches    imitrex/tylenol   . Hearing loss    bilateral - has hearing aids  . Insomnia   . Melanoma ([place])    back of left leg    Past Surgical History:  Procedure Laterality Date  . CESAREAN SECTION  1993  . CESAREAN SECTION  1998  . DILATION AND CURETTAGE OF UTERUS     MAB x 3  . LAPAROSCOPIC TOTAL HYSTERECTOMY  12/14   at [facility]  . SKIN BIOPSY      Current Outpatient Medications  Medication Sig Dispense Refill  . aspirin-acetaminophen-caffeine (EXCEDRIN MIGRAINE) 250-250-65 MG per tablet Take 1 tablet by mouth every 6 (six) hours as needed for pain.    . Calcium Carbonate-Vitamin D (CALCIUM 500 + D PO) Take 1 tablet by mouth daily.     Marland Kitchen ibuprofen (ADVIL,MOTRIN) 200 MG tablet Take 600 mg by mouth every 6 (six) hours as needed for pain.     . Multiple Vitamin (MULTIVITAMIN WITH MINERALS) TABS Take 1 tablet by mouth  daily.    . SUMAtriptan (IMITREX) 100 MG tablet TAKE ONE TABLET AT ONSET OF HEADACHE. MAY REPEAT IN TWO HOURS IF NEEDED. 9 tablet 5   No current facility-administered medications for this visit.     Family History  Problem Relation Age of Onset  . Breast cancer Mother 63  . Other Mother        memory issue  . Osteoporosis Mother   . Cancer Father        Prostate  . Cancer Paternal Grandmother        Basil cell-skin  . Stroke Paternal Grandfather   . Breast cancer Other 50       maternal cousin-bilateral mastectomy-fast growing    ROS:  Pertinent items are noted in HPI.  Otherwise, a comprehensive ROS was negative.  Exam:   BP 94/60 (BP Location: Right Arm, Patient Position: Sitting, Cuff Size: Normal)   Pulse 68   Resp 16   Ht 5\' 8"  (1.727 m)   Wt 140 lb (63.5 kg)   LMP 10/31/2013   BMI 21.29 kg/m   Weight change: stable  Height: 5\' 8"  (172.7 cm)  Ht Readings from Last 3 Encounters:  10/24/17 5\' 8"  (1.727 m)  11/11/16 5\' 8"  (1.727 m)  10/20/15 5\' 8"  (1.727 m)    General appearance: alert, cooperative and appears stated age Head: Normocephalic, without obvious abnormality, atraumatic Neck: no adenopathy, supple, symmetrical, trachea midline and thyroid normal to inspection and palpation Lungs: clear to auscultation bilaterally Breasts: normal appearance, no masses or tenderness Heart: regular rate and rhythm Abdomen: soft, non-tender; bowel sounds normal; no masses,  no organomegaly Extremities: extremities normal, atraumatic, no cyanosis or edema Skin: Skin color, texture, turgor normal. No rashes or lesions Lymph nodes: Cervical, supraclavicular, and axillary nodes normal. No abnormal inguinal nodes palpated Neurologic: Grossly normal   Pelvic: External genitalia:  no lesions              Urethra:  normal appearing urethra with no masses, tenderness or lesions              Bartholins and Skenes: normal                 Vagina: normal appearing vagina with normal  color and discharge, no lesions              Cervix: absent              Pap taken: No. Bimanual Exam:  Uterus:  uterus absent              Adnexa: no mass, fullness, tenderness               Rectovaginal: Confirms               Anus:  normal sphincter tone, no lesions  Chaperone was present for exam.  A:  Well Woman with normal exam H/o TLH/Bilateral salpingectomy/cystoscopy with h/o adhesions due to prior cesarean section and h/o bicornuate uterus PMP, no HRT H/O melanoma Intermittent vaginal dryness  P:   Mammogram guidelines reviewed. Will plan BMD around age 39.  Pt comfortable with plan Blood work UTD with PCP.  Did encourage her to have hep c testing with next blood work.   pap smear not obtained Rx for Imitrex 100mg  x 1, repeat in two hours.  #9/12 RF. Trial of Vit E vaginal suppositories 200u/ml, 1 pv three times weekly.  #36/3RF. return annually or prn

## 2017-10-29 ENCOUNTER — Telehealth: Payer: Self-pay | Admitting: Obstetrics & Gynecology

## 2017-10-29 NOTE — Telephone Encounter (Signed)
Spoke with patient. Patient started using Vit E vaginal suppositories Monday night, is concerned that she is still noticing the remnant of the vaginal suppository today and is d/t place another.   Denies any GYN symptoms.  Advised patinet as the vaginal suppository "melts" some of the suppository will absorb and some will not, this is normal.   Patient concerned about side effects of too much Vitamin E and intercourse while using vitamin E?   Advised patient can still have intercourse, would not recommend immediately after inserting suppository. Not aware of any side effects, will review this further with Dr. Sabra Heck and return call. Advised patient Dr. Sabra Heck is out of the office today, response may not be immediate, patient agreeable.  Dr. Sabra Heck -any side effects of Vit E vaginal suppositories?

## 2017-10-29 NOTE — Telephone Encounter (Signed)
Left message to call Shatiqua Heroux at [phone].  

## 2017-10-29 NOTE — Telephone Encounter (Signed)
Patient would like a call back today before 2:30 about a medication she is taking.  No other information given.

## 2017-10-29 NOTE — Telephone Encounter (Signed)
It's just Vit E but she can wait until she cannot feel any of the suppository before placing another one.  It's okay if it's not every four days.  And she may notice that they absorb better as the tissue improves.  Thanks.

## 2017-10-30 NOTE — Telephone Encounter (Signed)
Spoke with patient, advised as seen below per Dr. Sabra Heck. Patient verbalizes understanding and is agreeable. Will close encounter.

## 2017-11-05 ENCOUNTER — Ambulatory Visit
Admission: RE | Admit: 2017-11-05 | Discharge: 2017-11-05 | Disposition: A | Discharge status: Home / Self Care | Payer: BLUE CROSS/BLUE SHIELD | Source: Ambulatory Visit | Attending: Obstetrics & Gynecology | Admitting: Obstetrics & Gynecology

## 2017-11-05 DIAGNOSIS — Z1231 Encounter for screening mammogram for malignant neoplasm of breast: Secondary | ICD-10-CM | POA: Diagnosis not present

## 2018-03-18 DIAGNOSIS — D2262 Melanocytic nevi of left upper limb, including shoulder: Secondary | ICD-10-CM | POA: Diagnosis not present

## 2018-03-18 DIAGNOSIS — Z85828 Personal history of other malignant neoplasm of skin: Secondary | ICD-10-CM | POA: Diagnosis not present

## 2018-03-18 DIAGNOSIS — Z85820 Personal history of malignant melanoma of skin: Secondary | ICD-10-CM | POA: Diagnosis not present

## 2018-03-18 DIAGNOSIS — D2261 Melanocytic nevi of right upper limb, including shoulder: Secondary | ICD-10-CM | POA: Diagnosis not present

## 2018-03-30 DIAGNOSIS — M26629 Arthralgia of temporomandibular joint, unspecified side: Secondary | ICD-10-CM | POA: Diagnosis not present

## 2018-03-30 DIAGNOSIS — H903 Sensorineural hearing loss, bilateral: Secondary | ICD-10-CM | POA: Diagnosis not present

## 2018-03-30 DIAGNOSIS — K146 Glossodynia: Secondary | ICD-10-CM | POA: Diagnosis not present

## 2018-03-30 DIAGNOSIS — R51 Headache: Secondary | ICD-10-CM | POA: Diagnosis not present

## 2018-08-13 ENCOUNTER — Ambulatory Visit
Admission: RE | Admit: 2018-08-13 | Discharge: 2018-08-13 | Disposition: A | Discharge status: Home / Self Care | Payer: BLUE CROSS/BLUE SHIELD | Source: Ambulatory Visit | Attending: Family Medicine | Admitting: Family Medicine

## 2018-08-13 ENCOUNTER — Other Ambulatory Visit: Payer: Self-pay | Admitting: Family Medicine

## 2018-08-13 DIAGNOSIS — M542 Cervicalgia: Secondary | ICD-10-CM

## 2018-08-13 DIAGNOSIS — M79672 Pain in left foot: Secondary | ICD-10-CM | POA: Diagnosis not present

## 2018-08-13 DIAGNOSIS — M47812 Spondylosis without myelopathy or radiculopathy, cervical region: Secondary | ICD-10-CM | POA: Diagnosis not present

## 2018-08-13 DIAGNOSIS — M7732 Calcaneal spur, left foot: Secondary | ICD-10-CM | POA: Diagnosis not present

## 2018-08-13 DIAGNOSIS — Z23 Encounter for immunization: Secondary | ICD-10-CM | POA: Diagnosis not present

## 2018-09-10 NOTE — Progress Notes (Signed)
57 y.o. [id number] Married White or Caucasian female here for annual exam.  Son is at [employer] in dual Development worker, international aid.  Daughter got married in June.  They are living in [place].    Denies vaginal bleeding.      Patient's last menstrual period was 10/31/2013.          Sexually active: Yes.    The current method of family planning is status post hysterectomy.    Exercising: Yes.    swimming  Smoker:  no  Health Maintenance: Pap:  11/11/16 Neg  11/23/13 neg  History of abnormal Pap:  yes MMG:  11/05/17 BIRADS1:Neg  Colonoscopy:  12/05/14 Normal. f/u 5 years BMD:   Never TDaP:  2011 Pneumonia vaccine(s):  n/a Shingrix:   No Hep C testing: no Screening Labs: 11/18   reports that she has never smoked. She has never used smokeless tobacco. She reports that she drinks alcohol. She reports that she does not use drugs.  Past Medical History:  Diagnosis Date  . Anemia   . Anxiety    xanax  . Bicornate uterus   . Generalized headaches    imitrex/tylenol   . Hearing loss    bilateral - has hearing aids  . Insomnia   . Melanoma ([place])    back of left leg    Past Surgical History:  Procedure Laterality Date  . CESAREAN SECTION  1993  . CESAREAN SECTION  1998  . CYSTOSCOPY  07/23/2013   Procedure: CYSTOSCOPY;  Surgeon: Reece Packer, MD;  Location: [facility];  Service: Urology;;  . Brigitte Pulse AND CURETTAGE OF UTERUS     MAB x 3  . LAPAROSCOPIC TOTAL HYSTERECTOMY  12/14   at [facility]  . LAPAROSCOPY  07/23/2013   Procedure: LAPAROSCOPY DIAGNOSTIC;  Surgeon: Lyman Speller, MD;  Location: [facility];  Service: Gynecology;;  . SKIN BIOPSY      Current Outpatient Medications  Medication Sig Dispense Refill  . aspirin-acetaminophen-caffeine (EXCEDRIN MIGRAINE) 250-250-65 MG per tablet Take 1 tablet by mouth every 6 (six) hours as needed for pain.    . Calcium Carbonate-Vitamin D (CALCIUM 500 + D PO) Take 1 tablet by mouth daily.     . cyclobenzaprine  (FLEXERIL) 10 MG tablet Take 10 mg by mouth 3 (three) times daily as needed.  0  . ibuprofen (ADVIL,MOTRIN) 200 MG tablet Take 600 mg by mouth every 6 (six) hours as needed for pain.     . Multiple Vitamin (MULTIVITAMIN WITH MINERALS) TABS Take 1 tablet by mouth daily.    . naproxen (NAPROSYN) 500 MG tablet daily as needed.  1  . NONFORMULARY OR COMPOUNDED ITEM Vit E 200u/ml suppository.  Place vaginally three times weekly. 36 each 4  . SUMAtriptan (IMITREX) 100 MG tablet TAKE ONE TABLET AT ONSET OF HEADACHE. MAY REPEAT IN TWO HOURS IF NEEDED. 9 tablet 12   No current facility-administered medications for this visit.     Family History  Problem Relation Age of Onset  . Breast cancer Mother 41  . Other Mother        memory issue  . Osteoporosis Mother   . Cancer Father        Prostate  . Cancer Paternal Grandmother        Basil cell-skin  . Stroke Paternal Grandfather   . Breast cancer Other 57       maternal cousin-bilateral mastectomy-fast growing    Review of Systems  HENT: Positive for  hearing loss.   Genitourinary: Positive for dyspareunia and frequency.  Skin:       Hair loss  Neurological: Positive for headaches.  All other systems reviewed and are negative.   Exam:   BP 102/70 (BP Location: Right Arm, Patient Position: Sitting, Cuff Size: Normal)   Pulse 68   Resp 14   Ht 5\' 8"  (1.727 m)   Wt 154 lb 3.2 oz (69.9 kg)   LMP 10/31/2013   BMI 23.45 kg/m  Weight gain:  +14#  Height: 5\' 8"  (172.7 cm)  Ht Readings from Last 3 Encounters:  09/11/18 5\' 8"  (1.727 m)  10/24/17 5\' 8"  (1.727 m)  11/11/16 5\' 8"  (1.727 m)    General appearance: alert, cooperative and appears stated age Head: Normocephalic, without obvious abnormality, atraumatic Neck: no adenopathy, supple, symmetrical, trachea midline and thyroid normal to inspection and palpation Lungs: clear to auscultation bilaterally Breasts: normal appearance, no masses or tenderness Heart: regular rate and  rhythm Abdomen: soft, non-tender; bowel sounds normal; no masses,  no organomegaly Extremities: extremities normal, atraumatic, no cyanosis or edema Skin: Skin color, texture, turgor normal. No rashes or lesions Lymph nodes: Cervical, supraclavicular, and axillary nodes normal. No abnormal inguinal nodes palpated Neurologic: Grossly normal   Pelvic: External genitalia:  no lesions              Urethra:  normal appearing urethra with no masses, tenderness or lesions              Bartholins and Skenes: normal                 Vagina: normal appearing vagina with normal color and discharge, no lesions              Cervix: absent              Pap taken: No. Bimanual Exam:  Uterus:  uterus absent              Adnexa: no mass, fullness, tenderness               Rectovaginal: Confirms               Anus:  normal sphincter tone, no lesions  Chaperone was present for exam.  A:  Well Woman with normal exam PMP, no HRT H/O TLH/bilateral salpingectomy/cystoscopy with adhesions due to prior cesarean sections and h/o bicornuate uterus Vaginal atrophy, improved with Vit D H/O melanoma on leg Family hx of breast cancer in mother (now with recurrence) and maternal cousin   P:   Mammogram guidelines reviewed.  Pt doing 3D MMG. BMD recommended Colonoscopy due next year RX for Vie vaignal suppositories 200u/m, 1pv three times weekly.  336/4RF Information about Shingrix vaccine given pap smear not indicated return annually or prn

## 2018-09-11 ENCOUNTER — Ambulatory Visit (INDEPENDENT_AMBULATORY_CARE_PROVIDER_SITE_OTHER): Payer: BLUE CROSS/BLUE SHIELD | Admitting: Obstetrics & Gynecology

## 2018-09-11 ENCOUNTER — Encounter: Payer: Self-pay | Admitting: Obstetrics & Gynecology

## 2018-09-11 ENCOUNTER — Other Ambulatory Visit: Payer: Self-pay

## 2018-09-11 VITALS — BP 102/70 | HR 68 | Resp 14 | Ht 68.0 in | Wt 154.2 lb

## 2018-09-11 DIAGNOSIS — E2839 Other primary ovarian failure: Secondary | ICD-10-CM

## 2018-09-11 DIAGNOSIS — Z01419 Encounter for gynecological examination (general) (routine) without abnormal findings: Secondary | ICD-10-CM | POA: Diagnosis not present

## 2018-09-11 MED ORDER — NONFORMULARY OR COMPOUNDED ITEM: 4 refills | Status: DC

## 2018-09-11 NOTE — Patient Instructions (Addendum)
[facility] Phone: [phone]) Location: [place] level of [facility] and [place] ([place]) Hours: 7:30 a.m. to 6:00 p.m., Monday through Friday.   [facility] Phone: [phone] Location: [place] Hours: 7:30 a.m. to 6:00 p.m., Monday through Friday.   Oncotype testing.  This is my guess for what is being done for your mom.  It determines the benefit of chemotherapy.

## 2018-09-14 ENCOUNTER — Telehealth: Payer: Self-pay | Admitting: Obstetrics & Gynecology

## 2018-09-14 NOTE — Telephone Encounter (Signed)
Left message to call Suzanne Hicks at [phone].  

## 2018-09-14 NOTE — Telephone Encounter (Signed)
1. Patient has questions about her bone density test she needs to schedule.  2. Patient has questions about her new prescriptions she just got at her [facility]. She said [facility] automatically filled them and she is not sure why.

## 2018-09-15 ENCOUNTER — Other Ambulatory Visit: Payer: Self-pay | Admitting: Obstetrics & Gynecology

## 2018-09-15 DIAGNOSIS — Z1231 Encounter for screening mammogram for malignant neoplasm of breast: Secondary | ICD-10-CM

## 2018-09-15 NOTE — Telephone Encounter (Signed)
Spoke with patient.  1. Patient unsure where to schedule BMD. Advised patient order placed for BMD at [facility]. Patient will call directly to schedule.   2. Rx for Vit E suppositories sent to [facility] 09/11/18. Patient states she did not want this Rx filled yet, was unaware would automatically be filled. Patient has contacted pharmacy, RX placed on file, will call to advise when ready to be filled. Patient asking how to prevent this next time? Advised patient she can request printed RX or request provider add note to [facility] notifying pharmacy to place RX on file,  patient will call to fill.   Routing to provider for final review. Patient is agreeable to disposition. Will close encounter.

## 2018-09-18 ENCOUNTER — Telehealth: Payer: Self-pay | Admitting: Emergency Medicine

## 2018-09-18 DIAGNOSIS — Z803 Family history of malignant neoplasm of breast: Secondary | ICD-10-CM

## 2018-09-18 NOTE — Telephone Encounter (Signed)
Message left to return call to Kaymon Denomme at [phone].    

## 2018-09-18 NOTE — Telephone Encounter (Signed)
-----   Message from Megan Salon, MD sent at 09/18/2018 11:22 AM EDT ----- Regarding: genetic testing Could you please call this pt and let her know I reached out to one of the medical geneticists--Karen Powell--about her person and family hx.  She thinks she would qualify for genetic testing is this is something she wants to consider.  If she desires this, I can refer her for this consideration.  She would the breast cancer risks models with her as well.  The model I did with her was no high enough risk for yearly MRI.  Thanks.  Vinnie Level

## 2018-09-22 NOTE — Telephone Encounter (Signed)
Patient returned call and she is given message from Dr. Sabra Heck.  Patient states she does not want to have genetics testing if not covered by her insurance. She wants Breast MRI if covered. Discussed reasons for genetics consult only as geneticists are able to work with other methods and models to obtain possible information to ensure the right testing is recommended.  Pt agrees to referral. She would like to research as well.  She will call back if desires to plan breast MRI and does not want genetics consult.   Update to Dr. Sabra Heck.  Encounter closed.

## 2018-10-02 ENCOUNTER — Telehealth: Payer: Self-pay | Admitting: *Deleted

## 2018-10-02 NOTE — Telephone Encounter (Signed)
Pt has been scheduled to see Roma Kayser on 11/4 at 10am.

## 2018-10-05 ENCOUNTER — Encounter: Payer: Self-pay | Admitting: Genetic Counselor

## 2018-10-13 ENCOUNTER — Encounter: Payer: Self-pay | Admitting: Genetic Counselor

## 2018-10-13 NOTE — Progress Notes (Signed)
The patient called to let me know that her genetic counseling visit needs to be preauthorized.  I let her know that typically we do not have to preauthorize the [facility] visit, it is the testing that needs that and the lab would initiate it.  She became animated, letting me know that she has been on the phone for an hour and spoke with a supervisor who told her that the provider needs to initiate the preauthorization for the [facility] visit, that she was not able to do it and therefore I needed to do this.  I explained that the typical process is that [facility] visits do not need to be preauthorized, the lab is what is preauthorized.  I don't know how to do this, as I am not an expert in insurance, but I could see if someone from the financial office could do this.    I called and LM for Gaspar Bidding about this patient asking if she could help me with this preauthorization.

## 2018-10-16 DIAGNOSIS — D2272 Melanocytic nevi of left lower limb, including hip: Secondary | ICD-10-CM | POA: Diagnosis not present

## 2018-10-16 DIAGNOSIS — D485 Neoplasm of uncertain behavior of skin: Secondary | ICD-10-CM | POA: Diagnosis not present

## 2018-10-16 DIAGNOSIS — Z85820 Personal history of malignant melanoma of skin: Secondary | ICD-10-CM | POA: Diagnosis not present

## 2018-10-16 DIAGNOSIS — Z85828 Personal history of other malignant neoplasm of skin: Secondary | ICD-10-CM | POA: Diagnosis not present

## 2018-10-16 DIAGNOSIS — D2261 Melanocytic nevi of right upper limb, including shoulder: Secondary | ICD-10-CM | POA: Diagnosis not present

## 2018-10-16 DIAGNOSIS — D2262 Melanocytic nevi of left upper limb, including shoulder: Secondary | ICD-10-CM | POA: Diagnosis not present

## 2018-10-19 ENCOUNTER — Inpatient Hospital Stay: Payer: BLUE CROSS/BLUE SHIELD

## 2018-10-19 ENCOUNTER — Encounter: Payer: Self-pay | Admitting: Genetic Counselor

## 2018-10-19 ENCOUNTER — Inpatient Hospital Stay: Payer: BLUE CROSS/BLUE SHIELD | Attending: Genetic Counselor | Admitting: Genetic Counselor

## 2018-10-19 ENCOUNTER — Telehealth: Payer: Self-pay | Admitting: Genetic Counselor

## 2018-10-19 DIAGNOSIS — Z85820 Personal history of malignant melanoma of skin: Secondary | ICD-10-CM

## 2018-10-19 DIAGNOSIS — C4490 Unspecified malignant neoplasm of skin, unspecified: Secondary | ICD-10-CM | POA: Insufficient documentation

## 2018-10-19 DIAGNOSIS — Z803 Family history of malignant neoplasm of breast: Secondary | ICD-10-CM

## 2018-10-19 NOTE — Progress Notes (Signed)
REFERRING PROVIDER: Megan Salon, MD [place] [address], [place] [address]  PRIMARY PROVIDER:  Harlan Stains, MD  PRIMARY REASON FOR VISIT:  1. History of melanoma   2. Family history of breast cancer      HISTORY OF PRESENT ILLNESS:   Ms. Cookston, a 57 y.o. female, was seen for a [facility] cancer genetics consultation at the request of Dr. Sabra Heck due to a personal and family history of cancer.  Ms. Boutelle presents to clinic today to discuss the possibility of a hereditary predisposition to cancer, genetic testing, and to further clarify her future cancer risks, as well as potential cancer risks for family members.   In 2003, at the age of 47, Ms. Beaudry was diagnosed with melanoma of the left leg. This was treated with Mose surgery.  She is coming in today to learn more about her risk for breast cancer and determine if she is at high risk.     CANCER HISTORY:   No history exists.     HORMONAL RISK FACTORS:  Menarche was at age 22.  First live birth at age 63.  OCP use for approximately 2-3 years.  Ovaries intact: yes.  Hysterectomy: yes.  Menopausal status: postmenopausal.  HRT use: 0 years. Colonoscopy: yes; a few polyps, seen every 5 years. Mammogram within the last year: yes. Number of breast biopsies: 0. Up to date with pelvic exams:  yes. Any excessive radiation exposure in the past:  xray's and CT scans for her melanoma  Past Medical History:  Diagnosis Date  . Anemia   . Anxiety    xanax  . Bicornate uterus   . Family history of breast cancer   . Generalized headaches    imitrex/tylenol   . Hearing loss    bilateral - has hearing aids  . History of melanoma   . Insomnia   . Melanoma ([place])    back of left leg  . Skin cancer    Melanoma at 58    Past Surgical History:  Procedure Laterality Date  . CESAREAN SECTION  1993  . CESAREAN SECTION  1998  . CYSTOSCOPY  07/23/2013   Procedure: CYSTOSCOPY;  Surgeon: Reece Packer, MD;   Location: [facility];  Service: Urology;;  . Brigitte Pulse AND CURETTAGE OF UTERUS     MAB x 3  . LAPAROSCOPIC TOTAL HYSTERECTOMY  12/14   at [facility]  . LAPAROSCOPY  07/23/2013   Procedure: LAPAROSCOPY DIAGNOSTIC;  Surgeon: Lyman Speller, MD;  Location: [facility];  Service: Gynecology;;  . SKIN BIOPSY      Social History   Socioeconomic History  . Marital status: Married    Spouse name: Not on file  . Number of children: Not on file  . Years of education: Not on file  . Highest education level: Not on file  Occupational History  . Not on file  Social Needs  . Financial resource strain: Not on file  . Food insecurity:    Worry: Not on file    Inability: Not on file  . Transportation needs:    Medical: Not on file    Non-medical: Not on file  Tobacco Use  . Smoking status: Never Smoker  . Smokeless tobacco: Never Used  Substance and Sexual Activity  . Alcohol use: Yes    Alcohol/week: 0.0 standard drinks    Comment: socially  . Drug use: No  . Sexual activity: Yes    Partners: Male  Birth control/protection: None, Other-see comments    Comment: husband vasectomy, hysterectomy  Lifestyle  . Physical activity:    Days per week: Not on file    Minutes per session: Not on file  . Stress: Not on file  Relationships  . Social connections:    Talks on phone: Not on file    Gets together: Not on file    Attends religious service: Not on file    Active member of club or organization: Not on file    Attends meetings of clubs or organizations: Not on file    Relationship status: Not on file  Other Topics Concern  . Not on file  Social History Narrative  . Not on file     FAMILY HISTORY:  We obtained a detailed, 4-generation family history.  Significant diagnoses are listed below: Family History  Problem Relation Age of Onset  . Breast cancer Mother 85       Recurrence  . Other Mother        memory issue  . Osteoporosis Mother   . Cancer Father        Prostate  . Cancer  Paternal Grandmother        Basil cell-skin  . Stroke Paternal Grandfather   . Breast cancer Other 57       maternal cousin-bilateral mastectomy-fast growing  . Bladder Cancer Maternal Grandmother        d. 61s  . Lymphoma Paternal Aunt     The patient has two children who are cancer free, but reportedly have a diagnosis of MEN1 that was inherited from their father.  She has one brother who is cancer free.  The patient's parents are both living.  The patient's mother was diagnosed with breast cancer at 28.  IT was a triple negative breast cancer that is now metastatic at 32.  She has one sister who is cancer free, but that sister has a daughter who had breast cancer at 12.  The maternal grandparents are deceased.  The grandmother died of bladder cancer.  The patient's father had prostate cancer at 29.  He had two sisters, one who has Non-Hodgkin's Lymphoma.  The paternal grandparents are deceased.  The grandfather died of lung cancer and the grandmother died of a stroke.  Ms. Mayfield is unaware of previous family history of genetic testing for hereditary cancer risks. Patient's maternal ancestors are of [place] descent, and paternal ancestors are of [place] and [place] descent. There is no reported Ashkenazi Jewish ancestry. There is no known consanguinity.  GENETIC COUNSELING ASSESSMENT: Anylah Scheib is a 57 y.o. female with a personal history of melanoma and family history of breast cancer which is somewhat suggestive of a hereditary cancer syndrome and predisposition to cancer. We, therefore, discussed and recommended the following at today's visit.   DISCUSSION: We discussed that most breast cancer is sporadic, however, about 5-10% of breast cancer is hereditary.  There are several genes implicated for hereditary breast cancer, the ones that we are most familiar with are the BRCA genes.  Other genes include ATM, CHEK2 and PALB2.  Much of the visit was focused on her risk for breast cancer.   However, we reviewed the characteristics, features and inheritance patterns of hereditary cancer syndromes. We also discussed genetic testing, including the appropriate family members to test, the process of testing, insurance coverage and turn-around-time for results. We discussed the implications of a negative, positive and/or variant of uncertain significant result. We recommended Ms. Arline Asp pursue  genetic testing for the breast cancer gene panel vs the multi-cancer gene panel.  Based on Ms. Termini's personal and family history of cancer, she meets medical criteria for genetic testing. Despite that she meets criteria, she may still have an out of pocket cost. We discussed that if her out of pocket cost for testing is over $100, the laboratory will call and confirm whether she wants to proceed with testing.  If the out of pocket cost of testing is less than $100 she will be billed by the genetic testing laboratory.   In order to estimate her chance of having a BRCA mutation, we used statistical models (BRACAPro, Harriett Rush) and laboratory data that take into account her personal medical history, family history and ancestry.  Because each model is different, there can be a lot of variability in the risks they give.  Therefore, these numbers must be considered a rough range and not a precise risk of having a BRCA mutation.  These models estimate that she has approximately a 0.03-0.24% chance of having a mutation.    Based on the patient's personal and family history, statistical models (BRACAPro, [employer] and [employer])  and literature data were used to estimate her risk of developing breast cancer. These estimate her lifetime risk of developing breast cancer to be approximately 8.22% to 21.7%, with both the Baker Janus and [place] both indicating above a 20% risk (20.6 and 21.7, respectively). This estimation does not take into account any genetic testing results.  The patient's lifetime breast cancer risk  is a preliminary estimate based on available information using one of several models endorsed by the [place] (ACS). The ACS recommends consideration of breast MRI screening as an adjunct to mammography for patients at high risk (defined as 20% or greater lifetime risk). A more detailed breast cancer risk assessment can be considered, if clinically indicated.       Ms. Rens has been determined to be at high risk for breast cancer.  Therefore, we recommend that annual screening with mammography and breast MRI begin at age 91, or 10 years prior to the age of breast cancer diagnosis in a relative (whichever is earlier).  We discussed that Ms. Vankirk should discuss her individual situation with her referring physician and determine a breast cancer screening plan with which they are both comfortable.    We discussed a referral to the high risk breast clinic based on this assessment.  She did not feel that she needed this at this time, but would discuss with Dr. Sabra Heck about the utility of going to the clinic vs having MRI's ordered through Dr. Ammie Ferrier office.  If you would like to have Ms. Loadholt seen through the high risk clinic, please make a referral to this clinic. It can be scheduled through Isaiah Blakes, [phone].  PLAN: After considering the risks, benefits, and limitations, Ms. Eleazer agreed to have genetic testing preauthorized before considering further testing.  I would recommend the Multi-cancer panel based on her personal history of melanoma at a young age.   Lastly, we encouraged Ms. Ducey to remain in contact with cancer genetics annually so that we can continuously update the family history and inform her of any changes in cancer genetics and testing that may be of benefit for this family.   I offered to contact my genetics colleagues in [place] to see who they refer their MEN patient's to.  Ms. Daughdrill daughter is living in [place] and is looking for an  endocrinologist to  follow her.  Ms.  Maina questions were answered to her satisfaction today. Our contact information was provided should additional questions or concerns arise. Thank you for the referral and allowing [place] to share in the care of your patient.   Karen P. Florene Glen, [place], [facility] Certified Genetic Counselor [email or web address] phone: [phone]  The patient was seen for a total of 60 minutes in face-to-face genetic counseling.  This patient was discussed with Drs. Magrinat, Lindi Adie and/or Burr Medico who agrees with the above.    _______________________________________________________________________ For Office Staff:  Number of people involved in session: 1 Was an Intern/ student involved with case: no

## 2018-10-19 NOTE — Telephone Encounter (Signed)
Per [facility], the BI indicates that her OOP would be $0.  The [facility] endocrinologist that was recommended by the genetics folks is Lanier Clam.  [place] will call [employer] to confirm that it has been preapproved.  Santiago Glad

## 2018-10-20 ENCOUNTER — Telehealth: Payer: Self-pay | Admitting: Obstetrics & Gynecology

## 2018-10-20 NOTE — Telephone Encounter (Signed)
Due to emergency in office, I was unavailable. Thayer Ohm called patient at 3:10 pm. Scheduled office visit for 10-22-18 at 1130 am.   Routing to Dr Sabra Heck.   Encounter closed.

## 2018-10-20 NOTE — Telephone Encounter (Signed)
Patient returning Suzanne Hicks's call. No open phone note.

## 2018-10-20 NOTE — Telephone Encounter (Signed)
Patient is asking to talk with Dr.Miller directly to follow up after genetic testing. She stated that she does not want to talk with Dr.Miller's nurse. She asked if Dr.Miller would phone her before 3:00om.

## 2018-10-20 NOTE — Telephone Encounter (Signed)
Call to patient. Advised Dr Sabra Heck is with patient's all day. Patient declines office visit states she wants to speak with Dr Sabra Heck for 5 minutes, follow-up to annual exam issues.  Again advised that office visit for consult was recommended in order to speak with provider in timely manner.  Patient again declines and requests call back from Dr Sabra Heck at her convenience.      12 min phone call, Thayer Ohm called into office during last 8 minutes.

## 2018-10-20 NOTE — Telephone Encounter (Signed)
Patient states she is returning a call to Gay Filler and she can be reached after 3:00pm.

## 2018-10-22 ENCOUNTER — Encounter: Payer: Self-pay | Admitting: Obstetrics & Gynecology

## 2018-10-22 ENCOUNTER — Ambulatory Visit (INDEPENDENT_AMBULATORY_CARE_PROVIDER_SITE_OTHER): Payer: BLUE CROSS/BLUE SHIELD | Admitting: Obstetrics & Gynecology

## 2018-10-22 VITALS — BP 122/76 | HR 76 | Resp 16

## 2018-10-22 DIAGNOSIS — Z9189 Other specified personal risk factors, not elsewhere classified: Secondary | ICD-10-CM | POA: Diagnosis not present

## 2018-10-22 NOTE — Progress Notes (Signed)
GYNECOLOGY  VISIT  CC:   consultation  HPI: 57 y.o. [id number] Married White or Caucasian female here for consultation regarding genetics consultation that occurred with Roma Kayser on 10/19/18.  Pt called earlier this week and had frustrating conversation with office staff.  Discussed frustrations that she had and then addressed specific questions/concerns.  After this discussion, she had three specific questions regarding recent genetics consultation.  Had two genetics risk models that were done showing lifetime risk of breast cancer is >20%.  1) As she has met out of pocket for the year, should she go ahead with MRI this calendar year and why is MMG and MRI needed if going to do yearly MRI (because required by local radiology facilities).  She was advised maybe should have MMG and MRI six months apart and not sure she should do it this way this year?  Agreed with pt with proceed with precert (My office will do) and then scheduling (which she can do with breast center) after mammogram is complete this year.  D/w pt abbreviated breast MRI for screening for pt's at higher risk for breast cancer that can be done in the future.  Right now would not be covered by insurance but is shorter in length and easier to schedule.  Also, would recommend moving towards alternating every six months in the next few years.  She understands reasoning for this.  Questions answered.  2) Does not feel she needs to proceed with genetic testing at this time.  Does that seem reasonable?  Reminded pt that genetic testing is not only about her but about her family (children and future grandchildren) and, also, if positive, then additional procedures might be indicated depending on the results.  Specific situations discussed.  Voices understanding.  Is still considering this decision.  3) Was offered to be seen at the high risk breast clinic.  Pt wants to know benefit of doing this.  D/w pt risk reduction strategies including  increased screening and also treatment with Tamoxifen.  Questions about medication risks/side effects discussed.  The question I cannot answer for her is what is her specific risk reduction for breast cancer after taking Tamoxifen.  Could possibly find this out for her.  She is not sure this will make any difference.  Has decided not to go to high risk breast clinic at this time.    Discussion of above regarding only questions related to recent genetic visit began at 11:57am and ended at 12:25pm.    GYNECOLOGIC HISTORY: Patient's last menstrual period was 10/31/2013. Contraception: hysterectomy  Menopausal hormone therapy: none  Patient Active Problem List   Diagnosis Date Noted  . Skin cancer   . History of melanoma   . Family history of breast cancer   . Sensory hearing loss, bilateral 08/16/2016  . Hearing loss 10/20/2015    Past Medical History:  Diagnosis Date  . Anemia   . Anxiety    xanax  . Bicornate uterus   . Family history of breast cancer   . Generalized headaches    imitrex/tylenol   . Hearing loss    bilateral - has hearing aids  . History of melanoma   . Insomnia   . Melanoma ([place])    back of left leg  . Skin cancer    Melanoma at 2    Past Surgical History:  Procedure Laterality Date  . CESAREAN SECTION  1993  . CESAREAN SECTION  1998  . CYSTOSCOPY  07/23/2013   Procedure:  CYSTOSCOPY;  Surgeon: Reece Packer, MD;  Location: [facility];  Service: Urology;;  . Brigitte Pulse AND CURETTAGE OF UTERUS     MAB x 3  . LAPAROSCOPIC TOTAL HYSTERECTOMY  12/14   at [facility]  . LAPAROSCOPY  07/23/2013   Procedure: LAPAROSCOPY DIAGNOSTIC;  Surgeon: Lyman Speller, MD;  Location: [facility];  Service: Gynecology;;  . SKIN BIOPSY      MEDS:   Current Outpatient Medications on File Prior to Visit  Medication Sig Dispense Refill  . aspirin-acetaminophen-caffeine (EXCEDRIN MIGRAINE) 250-250-65 MG per tablet Take 1 tablet by mouth every 6 (six) hours as needed for pain.    .  Calcium Carbonate-Vitamin D (CALCIUM 500 + D PO) Take 1 tablet by mouth daily.     . cyclobenzaprine (FLEXERIL) 10 MG tablet Take 10 mg by mouth 3 (three) times daily as needed.  0  . ibuprofen (ADVIL,MOTRIN) 200 MG tablet Take 600 mg by mouth every 6 (six) hours as needed for pain.     . Multiple Vitamin (MULTIVITAMIN WITH MINERALS) TABS Take 1 tablet by mouth daily.    . naproxen (NAPROSYN) 500 MG tablet daily as needed.  1  . NONFORMULARY OR COMPOUNDED ITEM Vit E 200u/ml suppository.  Place vaginally three times weekly. 36 each 4  . SUMAtriptan (IMITREX) 100 MG tablet TAKE ONE TABLET AT ONSET OF HEADACHE. MAY REPEAT IN TWO HOURS IF NEEDED. 9 tablet 12   No current facility-administered medications on file prior to visit.     ALLERGIES: Patient has no known allergies.  Family History  Problem Relation Age of Onset  . Breast cancer Mother 56       Recurrence  . Other Mother        memory issue  . Osteoporosis Mother   . Cancer Father        Prostate  . Cancer Paternal Grandmother        Basil cell-skin  . Stroke Paternal Grandfather   . Breast cancer Other 57       maternal cousin-bilateral mastectomy-fast growing  . Bladder Cancer Maternal Grandmother        d. 33s  . Lymphoma Paternal Aunt     SH:  Married, non smoker  Review of Systems  All other systems reviewed and are negative.   PHYSICAL EXAMINATION:    BP 122/76 (BP Location: Right Arm, Patient Position: Sitting, Cuff Size: Normal)   Pulse 76   Resp 16   LMP 10/31/2013     General appearance: alert, cooperative and appears stated age No exam performed today  Assessment: Increased risk for breast cancer with family hx of breast cancer  Plan: Will proceed with precert for MRI now and schedule before end of year.  Pt has MMG already scheduled.   Will plan in next year or two to try and separate these appointment by six months Pt still considering having genetic testing done.  I think she is going to  proceed. Has decided at this time not to go to high risk breast clinic but we can help if she changes her mind in the future.   ~28 minutes spent with patient with all of this spent in face to face discussion of above.

## 2018-10-23 ENCOUNTER — Ambulatory Visit: Payer: BLUE CROSS/BLUE SHIELD | Admitting: Obstetrics & Gynecology

## 2018-10-26 ENCOUNTER — Other Ambulatory Visit: Payer: Self-pay | Admitting: *Deleted

## 2018-10-26 DIAGNOSIS — Z1231 Encounter for screening mammogram for malignant neoplasm of breast: Secondary | ICD-10-CM

## 2018-10-27 DIAGNOSIS — Z0000 Encounter for general adult medical examination without abnormal findings: Secondary | ICD-10-CM | POA: Diagnosis not present

## 2018-10-27 DIAGNOSIS — Z13220 Encounter for screening for lipoid disorders: Secondary | ICD-10-CM | POA: Diagnosis not present

## 2018-10-29 DIAGNOSIS — Z23 Encounter for immunization: Secondary | ICD-10-CM | POA: Diagnosis not present

## 2018-10-30 ENCOUNTER — Ambulatory Visit
Admission: RE | Admit: 2018-10-30 | Discharge: 2018-10-30 | Disposition: A | Discharge status: Home / Self Care | Payer: BLUE CROSS/BLUE SHIELD | Source: Ambulatory Visit | Attending: Obstetrics & Gynecology | Admitting: Obstetrics & Gynecology

## 2018-10-30 ENCOUNTER — Telehealth: Payer: Self-pay | Admitting: Obstetrics & Gynecology

## 2018-10-30 DIAGNOSIS — M79672 Pain in left foot: Secondary | ICD-10-CM | POA: Diagnosis not present

## 2018-10-30 DIAGNOSIS — Z1231 Encounter for screening mammogram for malignant neoplasm of breast: Secondary | ICD-10-CM | POA: Diagnosis not present

## 2018-10-30 DIAGNOSIS — M8589 Other specified disorders of bone density and structure, multiple sites: Secondary | ICD-10-CM | POA: Diagnosis not present

## 2018-10-30 DIAGNOSIS — E2839 Other primary ovarian failure: Secondary | ICD-10-CM

## 2018-10-30 DIAGNOSIS — Z780 Asymptomatic menopausal state: Secondary | ICD-10-CM | POA: Diagnosis not present

## 2018-10-30 DIAGNOSIS — M542 Cervicalgia: Secondary | ICD-10-CM | POA: Diagnosis not present

## 2018-10-30 NOTE — Telephone Encounter (Signed)
Patient called and said she had her bone density test and her mammogram today at the [facility]. She requests a call back once the results are ready.   Patient also reported she has scheduled a breast MRI for 11/26/18 at 8:50AM and will need a prior authorization for that.  Cc: Deloris Ping

## 2018-11-02 ENCOUNTER — Telehealth: Payer: Self-pay | Admitting: *Deleted

## 2018-11-02 NOTE — Telephone Encounter (Signed)
Patient walked in to office regarding mammogram results. Per Dr Ammie Ferrier instructions advised to notify patient mammogram was within normal limits. Also advised patient that Suzanne Hicks will call her once her pre-certification for her breast MRI was completed. Patient requests that the results for mammogram and bone density be mailed to her. Patient appreciative.    Routing to provider and will close encounter.

## 2018-11-02 NOTE — Telephone Encounter (Signed)
Breast MRI scheduled for 11/26/18 at [facility]. Patient stopped in to confirm Suzanne Hicks would call her when the authorization is done.  Patient stopped by requesting to speak with a nurse to confirm the results [facility] left on her voice [email or web address]  Cc: Lawerance Cruel

## 2018-11-02 NOTE — Telephone Encounter (Signed)
Notes recorded by Polly Cobia, CMA on 11/02/2018 at 9:37 AM EST LM for pt with normal results. Per pt release form. ------  Notes recorded by Megan Salon, MD on 11/02/2018 at 9:33 AM EST Pt also desires to know that her MMG is negative. Routing to [place] to precert breast MRI due to increased personal hx of breast cancer. Breast MRI is already scheduled.

## 2018-11-02 NOTE — Telephone Encounter (Signed)
Patient called back states she didn't understand her voicemail left on her cell number listed on DPR. Patient hanged up before I could pick up call.   Called patient back at her home number. Left voicemail to call back only.

## 2018-11-03 NOTE — Telephone Encounter (Signed)
Patient has been made aware of results of Mammogram and Bone density by Dr. Ammie Ferrier CMA Lowell Bouton.  MRI has been precerted.  Will close encounter.

## 2018-11-05 DIAGNOSIS — M79672 Pain in left foot: Secondary | ICD-10-CM | POA: Diagnosis not present

## 2018-11-05 DIAGNOSIS — M542 Cervicalgia: Secondary | ICD-10-CM | POA: Diagnosis not present

## 2018-11-09 NOTE — Addendum Note (Signed)
Addended by: Clarene Essex on: 11/09/2018 12:30 PM   Modules accepted: Orders

## 2018-11-11 DIAGNOSIS — M542 Cervicalgia: Secondary | ICD-10-CM | POA: Diagnosis not present

## 2018-11-11 DIAGNOSIS — M79672 Pain in left foot: Secondary | ICD-10-CM | POA: Diagnosis not present

## 2018-11-19 ENCOUNTER — Telehealth: Payer: Self-pay | Admitting: Obstetrics & Gynecology

## 2018-11-19 NOTE — Telephone Encounter (Signed)
Patient has questions for Suzanne Hicks about her MRI referral.

## 2018-11-19 NOTE — Telephone Encounter (Signed)
Spoke with patient. Confirmed with patient scheduled MRI has been approved by her insurance company, [employer]. The authorization number is [id number] valid 11/15/18 through 12/14/18. No further question. Will close encounter

## 2018-11-20 DIAGNOSIS — M79672 Pain in left foot: Secondary | ICD-10-CM | POA: Diagnosis not present

## 2018-11-20 DIAGNOSIS — M542 Cervicalgia: Secondary | ICD-10-CM | POA: Diagnosis not present

## 2018-11-24 ENCOUNTER — Inpatient Hospital Stay: Payer: BLUE CROSS/BLUE SHIELD | Attending: Genetic Counselor

## 2018-11-24 DIAGNOSIS — Z803 Family history of malignant neoplasm of breast: Secondary | ICD-10-CM

## 2018-11-24 DIAGNOSIS — Z85820 Personal history of malignant melanoma of skin: Secondary | ICD-10-CM | POA: Diagnosis not present

## 2018-11-24 DIAGNOSIS — Z8042 Family history of malignant neoplasm of prostate: Secondary | ICD-10-CM | POA: Diagnosis not present

## 2018-11-24 DIAGNOSIS — Z809 Family history of malignant neoplasm, unspecified: Secondary | ICD-10-CM | POA: Diagnosis not present

## 2018-11-24 DIAGNOSIS — Z800 Family history of malignant neoplasm of digestive organs: Secondary | ICD-10-CM | POA: Diagnosis not present

## 2018-11-26 ENCOUNTER — Ambulatory Visit
Admission: RE | Admit: 2018-11-26 | Discharge: 2018-11-26 | Disposition: A | Discharge status: Home / Self Care | Payer: BLUE CROSS/BLUE SHIELD | Source: Ambulatory Visit | Attending: Obstetrics & Gynecology | Admitting: Obstetrics & Gynecology

## 2018-11-26 DIAGNOSIS — Z853 Personal history of malignant neoplasm of breast: Secondary | ICD-10-CM | POA: Diagnosis not present

## 2018-11-26 DIAGNOSIS — Z1231 Encounter for screening mammogram for malignant neoplasm of breast: Secondary | ICD-10-CM

## 2018-11-26 MED ORDER — GADOBUTROL 1 MMOL/ML IV SOLN
7.0000 mL | Freq: Once | INTRAVENOUS | Status: AC | PRN
  Administered 2018-11-26: 7 mL via INTRAVENOUS

## 2018-11-27 ENCOUNTER — Telehealth: Payer: Self-pay | Admitting: *Deleted

## 2018-11-27 DIAGNOSIS — M79672 Pain in left foot: Secondary | ICD-10-CM | POA: Diagnosis not present

## 2018-11-27 DIAGNOSIS — M542 Cervicalgia: Secondary | ICD-10-CM | POA: Diagnosis not present

## 2018-11-27 NOTE — Telephone Encounter (Signed)
Notes recorded by Burnice Logan, RN on 11/27/2018 at 9:45 AM EST Left message to call Sharee Pimple, RN at [place].   Last AEX 09/11/18 Next AEX 08/27/19 ------

## 2018-11-27 NOTE — Telephone Encounter (Signed)
Spoke with patient, advised as seen below per Dr. Sabra Heck. Patient request copy of MRI reports be mailed to address on file. Patient verbalizes understanding and is agreeable.   11/26/18 Breast MRI reports printed and placed in standard [place] Mail.   Encounter closed.

## 2018-11-27 NOTE — Telephone Encounter (Signed)
-----   Message from Megan Salon, MD sent at 11/27/2018  8:15 AM EST ----- Please let pt know her breast MRI was negative.  She will need her MMG again in November 2020.  She and I will discuss when to repeat her breast MRI when I see her next.  Please see if appt can be close to one year from last one.  Thanks.  Out of imaging hold.

## 2018-12-01 DIAGNOSIS — M542 Cervicalgia: Secondary | ICD-10-CM | POA: Diagnosis not present

## 2018-12-03 ENCOUNTER — Encounter: Payer: Self-pay | Admitting: Genetic Counselor

## 2018-12-03 ENCOUNTER — Telehealth: Payer: Self-pay | Admitting: Genetic Counselor

## 2018-12-03 DIAGNOSIS — Z1379 Encounter for other screening for genetic and chromosomal anomalies: Secondary | ICD-10-CM | POA: Insufficient documentation

## 2018-12-03 NOTE — Telephone Encounter (Signed)
Revealed negative genetic testing.  Discussed that we do not know why why there is cancer in the family. It could be due to a different gene that we are not testing, or maybe our current technology may not be able to pick something up.  It will be important for her to keep in contact with genetics to keep up with whether additional testing may be needed.  

## 2018-12-04 ENCOUNTER — Ambulatory Visit: Payer: Self-pay | Admitting: Genetic Counselor

## 2018-12-04 ENCOUNTER — Encounter: Payer: Self-pay | Admitting: Genetic Counselor

## 2018-12-04 DIAGNOSIS — Z1379 Encounter for other screening for genetic and chromosomal anomalies: Secondary | ICD-10-CM

## 2018-12-04 NOTE — Progress Notes (Signed)
HPI:  Ms. Behrle was previously seen in the [facility] clinic due to a personal and family history of cancer and concerns regarding a hereditary predisposition to cancer. Please refer to our prior cancer genetics clinic note for more information regarding Ms. Chimenti's medical, social and family histories, and our assessment and recommendations, at the time. Ms. Spinola recent genetic test results were disclosed to her, as were recommendations warranted by these results. These results and recommendations are discussed in more detail below.  CANCER HISTORY:   No history exists.    FAMILY HISTORY:  We obtained a detailed, 4-generation family history.  Significant diagnoses are listed below: Family History  Problem Relation Age of Onset  . Breast cancer Mother 12       Recurrence  . Other Mother        memory issue  . Osteoporosis Mother   . Cancer Father        Prostate  . Cancer Paternal Grandmother        Basil cell-skin  . Stroke Paternal Grandfather   . Breast cancer Other 57       maternal cousin-bilateral mastectomy-fast growing  . Bladder Cancer Maternal Grandmother        d. 26s  . Lymphoma Paternal Aunt     The patient has two children who are cancer free, but reportedly have a diagnosis of MEN1 that was inherited from their father.  She has one brother who is cancer free.  The patient's parents are both living.  The patient's mother was diagnosed with breast cancer at 41.  IT was a triple negative breast cancer that is now metastatic at 76.  She has one sister who is cancer free, but that sister has a daughter who had breast cancer at 11.  The maternal grandparents are deceased.  The grandmother died of bladder cancer.  The patient's father had prostate cancer at 90.  He had two sisters, one who has Non-Hodgkin's Lymphoma.  The paternal grandparents are deceased.  The grandfather died of lung cancer and the grandmother died of a stroke.  Ms. Schleicher is unaware of  previous family history of genetic testing for hereditary cancer risks. Patient's maternal ancestors are of [place] descent, and paternal ancestors are of [place] and [place] descent. There is no reported Ashkenazi Jewish ancestry. There is no known consanguinity.  GENETIC TEST RESULTS: Genetic testing reported out on December 01, 2018 through the Multi-cancer panel found no deleterious mutations. The Multi-Gene Panel offered by [facility] includes sequencing and/or deletion duplication testing of the following 85 genes: AIP, ALK, APC, ATM, AXIN2,BAP1,  BARD1, BLM, BMPR1A, BRCA1, BRCA2, BRIP1, CASR, CDC73, CDH1, CDK4, CDKN1B, CDKN1C, CDKN2A (p14ARF), CDKN2A (p16INK4a), CEBPA, CHEK2, CTNNA1, DICER1, DIS3L2, EGFR (c.2369C>T, p.Thr790Met variant only), EPCAM (Deletion/duplication testing only), FH, FLCN, GATA2, GPC3, GREM1 (Promoter region deletion/duplication testing only), HOXB13 (c.251G>A, p.Gly84Glu), HRAS, KIT, MAX, MEN1, MET, MITF (c.952G>A, p.Glu318Lys variant only), MLH1, MSH2, MSH3, MSH6, MUTYH, NBN, NF1, NF2, NTHL1, PALB2, PDGFRA, PHOX2B, PMS2, POLD1, POLE, POT1, PRKAR1A, PTCH1, PTEN, RAD50, RAD51C, RAD51D, RB1, RECQL4, RET, RNF43, RUNX1, SDHAF2, SDHA (sequence changes only), SDHB, SDHC, SDHD, SMAD4, SMARCA4, SMARCB1, SMARCE1, STK11, SUFU, TERC, TERT, TMEM127, TP53, TSC1, TSC2, VHL, WRN and WT1.   The test report has been scanned into EPIC and is located under the Molecular Pathology section of the Results Review tab.    We discussed with Ms. Ditommaso that since the current genetic testing is not perfect, it is possible there may be a gene  mutation in one of these genes that current testing cannot detect, but that chance is small.  We also discussed, that it is possible that another gene that has not yet been discovered, or that we have not yet tested, is responsible for the cancer diagnoses in the family, and it is, therefore, important to remain in touch with cancer genetics in the future so that we can continue  to offer Ms. Muratore the most up to date genetic testing.    CANCER SCREENING RECOMMENDATIONS: This normal result is reassuring and indicates that Ms. Leet does not likely have an increased risk of cancer due to a mutation in one of these genes.  We, therefore, recommended  Ms. Pelley continue to follow the cancer screening guidelines provided by her primary healthcare providers.   An individual's cancer risk and medical management are not determined by genetic test results alone. Overall cancer risk assessment incorporates additional factors, including personal medical history, family history, and any available genetic information that may result in a personalized plan for cancer prevention and surveillance.  RECOMMENDATIONS FOR FAMILY MEMBERS:  Women in this family might be at some increased risk of developing cancer, over the general population risk, simply due to the family history of cancer.  We recommended women in this family have a yearly mammogram beginning at age 68, or 64 years younger than the earliest onset of cancer, an annual clinical breast exam, and perform monthly breast self-exams. Women in this family should also have a gynecological exam as recommended by their primary provider. All family members should have a colonoscopy by age 3.  FOLLOW-UP: Lastly, we discussed with Ms. Coste that cancer genetics is a rapidly advancing field and it is possible that new genetic tests will be appropriate for her and/or her family members in the future. We encouraged her to remain in contact with cancer genetics on an annual basis so we can update her personal and family histories and let her know of advances in cancer genetics that may benefit this family.   Our contact number was provided. Ms. Balash questions were answered to her satisfaction, and she knows she is welcome to call us at anytime with additional questions or concerns.   Roma Kayser, MS, [facility] Certified Genetic  Counselor Santiago [email or web address]

## 2018-12-07 DIAGNOSIS — M79672 Pain in left foot: Secondary | ICD-10-CM | POA: Diagnosis not present

## 2018-12-07 DIAGNOSIS — M542 Cervicalgia: Secondary | ICD-10-CM | POA: Diagnosis not present

## 2018-12-11 DIAGNOSIS — M542 Cervicalgia: Secondary | ICD-10-CM | POA: Diagnosis not present

## 2018-12-14 DIAGNOSIS — M79672 Pain in left foot: Secondary | ICD-10-CM | POA: Diagnosis not present

## 2018-12-14 DIAGNOSIS — M542 Cervicalgia: Secondary | ICD-10-CM | POA: Diagnosis not present

## 2018-12-15 DIAGNOSIS — M542 Cervicalgia: Secondary | ICD-10-CM | POA: Diagnosis not present

## 2018-12-23 DIAGNOSIS — M5030 Other cervical disc degeneration, unspecified cervical region: Secondary | ICD-10-CM | POA: Diagnosis not present

## 2018-12-23 DIAGNOSIS — M542 Cervicalgia: Secondary | ICD-10-CM | POA: Diagnosis not present

## 2018-12-25 DIAGNOSIS — M79672 Pain in left foot: Secondary | ICD-10-CM | POA: Diagnosis not present

## 2018-12-25 DIAGNOSIS — M542 Cervicalgia: Secondary | ICD-10-CM | POA: Diagnosis not present

## 2018-12-29 DIAGNOSIS — Z23 Encounter for immunization: Secondary | ICD-10-CM | POA: Diagnosis not present

## 2018-12-29 DIAGNOSIS — M47812 Spondylosis without myelopathy or radiculopathy, cervical region: Secondary | ICD-10-CM | POA: Diagnosis not present

## 2019-05-28 DIAGNOSIS — D2261 Melanocytic nevi of right upper limb, including shoulder: Secondary | ICD-10-CM | POA: Diagnosis not present

## 2019-05-28 DIAGNOSIS — D2262 Melanocytic nevi of left upper limb, including shoulder: Secondary | ICD-10-CM | POA: Diagnosis not present

## 2019-05-28 DIAGNOSIS — Z85820 Personal history of malignant melanoma of skin: Secondary | ICD-10-CM | POA: Diagnosis not present

## 2019-05-28 DIAGNOSIS — Z85828 Personal history of other malignant neoplasm of skin: Secondary | ICD-10-CM | POA: Diagnosis not present

## 2019-06-15 DIAGNOSIS — M542 Cervicalgia: Secondary | ICD-10-CM | POA: Diagnosis not present

## 2019-07-23 DIAGNOSIS — M542 Cervicalgia: Secondary | ICD-10-CM | POA: Diagnosis not present

## 2019-07-23 DIAGNOSIS — M50221 Other cervical disc displacement at C4-C5 level: Secondary | ICD-10-CM | POA: Diagnosis not present

## 2019-07-23 DIAGNOSIS — M791 Myalgia, unspecified site: Secondary | ICD-10-CM | POA: Diagnosis not present

## 2019-07-23 DIAGNOSIS — M50321 Other cervical disc degeneration at C4-C5 level: Secondary | ICD-10-CM | POA: Diagnosis not present

## 2019-07-23 DIAGNOSIS — R202 Paresthesia of skin: Secondary | ICD-10-CM | POA: Diagnosis not present

## 2019-07-26 DIAGNOSIS — R202 Paresthesia of skin: Secondary | ICD-10-CM | POA: Diagnosis not present

## 2019-07-26 DIAGNOSIS — M50221 Other cervical disc displacement at C4-C5 level: Secondary | ICD-10-CM | POA: Diagnosis not present

## 2019-07-26 DIAGNOSIS — M542 Cervicalgia: Secondary | ICD-10-CM | POA: Diagnosis not present

## 2019-07-26 DIAGNOSIS — M50321 Other cervical disc degeneration at C4-C5 level: Secondary | ICD-10-CM | POA: Diagnosis not present

## 2019-07-26 DIAGNOSIS — M791 Myalgia, unspecified site: Secondary | ICD-10-CM | POA: Diagnosis not present

## 2019-07-27 ENCOUNTER — Telehealth: Payer: Self-pay | Admitting: *Deleted

## 2019-07-27 ENCOUNTER — Telehealth: Payer: Self-pay | Admitting: Neurology

## 2019-07-27 ENCOUNTER — Ambulatory Visit (INDEPENDENT_AMBULATORY_CARE_PROVIDER_SITE_OTHER): Payer: BC Managed Care – PPO | Admitting: Neurology

## 2019-07-27 ENCOUNTER — Other Ambulatory Visit: Payer: Self-pay

## 2019-07-27 ENCOUNTER — Encounter: Payer: Self-pay | Admitting: Neurology

## 2019-07-27 VITALS — BP 102/76 | HR 76 | Temp 97.5°F | Ht 68.5 in | Wt 160.0 lb

## 2019-07-27 DIAGNOSIS — M542 Cervicalgia: Secondary | ICD-10-CM

## 2019-07-27 DIAGNOSIS — H532 Diplopia: Secondary | ICD-10-CM

## 2019-07-27 DIAGNOSIS — G4489 Other headache syndrome: Secondary | ICD-10-CM

## 2019-07-27 NOTE — Patient Instructions (Addendum)
MRI of the brain with and without contrast Lab test Numbness in the hands: Wrist splints at night, consider EMG/NCS in the future if needed Neck Pain: Recommend following prior recommendations including cervical pain injections Medications to consider: Gabapentin, Lyrica, Cymbalta, Nortriptyline    Carpal Tunnel Syndrome  Carpal tunnel syndrome is a condition that causes pain in your hand and arm. The carpal tunnel is a narrow area located on the palm side of your wrist. Repeated wrist motion or certain diseases may cause swelling within the tunnel. This swelling pinches the main nerve in the wrist (median nerve). What are the causes? This condition may be caused by:  Repeated wrist motions.  Wrist injuries.  Arthritis.  A cyst or tumor in the carpal tunnel.  Fluid buildup during pregnancy. Sometimes the cause of this condition is not known. What increases the risk? The following factors may make you more likely to develop this condition:  Having a job, such as being a Research scientist (life sciences), that requires you to repeatedly move your wrist in the same motion.  Being a woman.  Having certain conditions, such as: ? Diabetes. ? Obesity. ? An underactive thyroid (hypothyroidism). ? Kidney failure. What are the signs or symptoms? Symptoms of this condition include:  A tingling feeling in your fingers, especially in your thumb, index, and middle fingers.  Tingling or numbness in your hand.  An aching feeling in your entire arm, especially when your wrist and elbow are bent for a long time.  Wrist pain that goes up your arm to your shoulder.  Pain that goes down into your palm or fingers.  A weak feeling in your hands. You may have trouble grabbing and holding items. Your symptoms may feel worse during the night. How is this diagnosed? This condition is diagnosed with a medical history and physical exam. You may also have tests, including:  Electromyogram (EMG). This test  measures electrical signals sent by your nerves into the muscles.  Nerve conduction study. This test measures how well electrical signals pass through your nerves.  Imaging tests, such as X-rays, ultrasound, and MRI. These tests check for possible causes of your condition. How is this treated? This condition may be treated with:  Lifestyle changes. It is important to stop or change the activity that caused your condition.  Doing exercise and activities to strengthen your muscles and bones (physical therapy).  Learning how to use your hand again after diagnosis (occupational therapy).  Medicines for pain and inflammation. This may include medicine that is injected into your wrist.  A wrist splint.  Surgery. Follow these instructions at home: If you have a splint:  Wear the splint as told by your health care provider. Remove it only as told by your health care provider.  Loosen the splint if your fingers tingle, become numb, or turn cold and blue.  Keep the splint clean.  If the splint is not waterproof: ? Do not let it get wet. ? Cover it with a watertight covering when you take a bath or shower. Managing pain, stiffness, and swelling   If directed, put ice on the painful area: ? If you have a removable splint, remove it as told by your health care provider. ? Put ice in a plastic bag. ? Place a towel between your skin and the bag. ? Leave the ice on for 20 minutes, 2-3 times per day. General instructions  Take over-the-counter and prescription medicines only as told by your health care provider.  Rest your wrist from any activity that may be causing your pain. If your condition is work related, talk with your employer about changes that can be made, such as getting a wrist pad to use while typing.  Do any exercises as told by your health care provider, physical therapist, or occupational therapist.  Keep all follow-up visits as told by your health care provider. This is  important. Contact a health care provider if:  You have new symptoms.  Your pain is not controlled with medicines.  Your symptoms get worse. Get help right away if:  You have severe numbness or tingling in your wrist or hand. Summary  Carpal tunnel syndrome is a condition that causes pain in your hand and arm.  It is usually caused by repeated wrist motions.  Lifestyle changes and medicines are used to treat carpal tunnel syndrome. Surgery may be recommended.  Follow your health care provider's instructions about wearing a splint, resting from activity, keeping follow-up visits, and calling for help. This information is not intended to replace advice given to you by your health care provider. Make sure you discuss any questions you have with your health care provider. Document Released: 11/29/2000 Document Revised: 04/10/2018 Document Reviewed: 04/10/2018 Elsevier Patient Education  2020 [place] is a test to check how well your muscles and nerves are working. This procedure includes the combined use of electromyogram (EMG) and nerve conduction study (NCS). EMG is used to look for muscular disorders. NCS, which is also called electroneurogram, measures how well your nerves are controlling your muscles. The procedures are usually done together to check if your muscles and nerves are healthy. If the results of the tests are abnormal, this may indicate disease or injury, such as a neuromuscular disease or peripheral nerve damage. Tell a health care provider about:  Any allergies you have.  All medicines you are taking, including vitamins, herbs, eye drops, creams, and over-the-counter medicines.  Any problems you or family members have had with anesthetic medicines.  Any blood disorders you have.  Any surgeries you have had.  Any medical conditions you have.  If you have a pacemaker.  Whether you are pregnant or may be  pregnant. What are the risks? Generally, this is a safe procedure. However, problems may occur, including:  Infection where the electrodes were inserted.  Bleeding. What happens before the procedure? Medicines Ask your health care provider about:  Changing or stopping your regular medicines. This is especially important if you are taking diabetes medicines or blood thinners.  Taking medicines such as aspirin and ibuprofen. These medicines can thin your blood. Do not take these medicines unless your health care provider tells you to take them.  Taking over-the-counter medicines, vitamins, herbs, and supplements. General instructions  Your health care provider may ask you to avoid: ? Beverages that have caffeine, such as coffee and tea. ? Any products that contain nicotine or tobacco. These products include cigarettes, e-cigarettes, and chewing tobacco. If you need help quitting, ask your health care provider.  Do not use lotions or creams on the same day that you will be having the procedure. What happens during the procedure? For EMG   Your health care provider will ask you to stay in a position so that he or she can access the muscle that will be studied. You may be standing, sitting, or lying down.  You may be given a medicine that numbs the area (local anesthetic).  A very thin  needle that has an electrode will be inserted into your muscle.  Another small electrode will be placed on your skin near the muscle.  Your health care provider will ask you to continue to remain still.  The electrodes will send a signal that tells about the electrical activity of your muscles. You may see this on a monitor or hear it in the room.  After your muscles have been studied at rest, your health care provider will ask you to contract or flex your muscles. The electrodes will send a signal that tells about the electrical activity of your muscles.  Your health care provider will remove the  electrodes and the electrode needles when the procedure is finished. The procedure may vary among health care providers and hospitals. For NCS   An electrode that records your nerve activity (recording electrode) will be placed on your skin by the muscle that is being studied.  An electrode that is used as a reference (reference electrode) will be placed near the recording electrode.  A paste or gel will be applied to your skin between the recording electrode and the reference electrode.  Your nerve will be stimulated with a mild shock. Your health care provider will measure how much time it takes for your muscle to react.  Your health care provider will remove the electrodes and the gel when the procedure is finished. The procedure may vary among health care providers and hospitals. What happens after the procedure?  It is up to you to get the results of your procedure. Ask your health care provider, or the department that is doing the procedure, when your results will be ready.  Your health care provider may: ? Give you medicines for any pain. ? Monitor the insertion sites to make sure that bleeding stops. Summary  Electromyoneurogram is a test to check how well your muscles and nerves are working.  If the results of the tests are abnormal, this may indicate disease or injury.  This is a safe procedure. However, problems may occur, such as bleeding and infection.  Your health care provider will do two tests to complete this procedure. One checks your muscles (EMG) and another checks your nerves (NCS).  It is up to you to get the results of your procedure. Ask your health care provider, or the department that is doing the procedure, when your results will be ready. This information is not intended to replace advice given to you by your health care provider. Make sure you discuss any questions you have with your health care provider. Document Released: 04/04/2005 Document Revised:  08/18/2018 Document Reviewed: 07/31/2018 Elsevier Patient Education  [place].    Cervical Radiculopathy  Cervical radiculopathy means that a nerve in the neck (a cervical nerve) is pinched or bruised. This can happen because of an injury to the cervical spine (vertebrae) in the neck, or as a normal part of getting older. This can cause pain or loss of feeling (numbness) that runs from your neck all the way down to your arm and fingers. Often, this condition gets better with rest. Treatment may be needed if the condition does not get better. What are the causes?  A neck injury.  A bulging disk in your spine.  Muscle movements that you cannot control (muscle spasms).  Tight muscles in your neck due to overuse.  Arthritis.  Breakdown in the bones and joints of the spine (spondylosis) due to getting older.  Bone spurs that form near the  nerves in the neck. What are the signs or symptoms?  Pain. The pain may: ? Run from the neck to the arm and hand. ? Be very bad or irritating. ? Be worse when you move your neck.  Loss of feeling or tingling in your arm or hand.  Weakness in your arm or hand, in very bad cases. How is this treated? In many cases, treatment is not needed for this condition. With rest, the condition often gets better over time. If treatment is needed, options may include:  Wearing a soft neck collar (cervical collar) for short periods of time, as told by your doctor.  Doing exercises (physical therapy) to strengthen your neck muscles.  Taking medicines.  Having shots (injections) in your spine, in very bad cases.  Having surgery. This may be needed if other treatments do not help. The type of surgery that is used depends on the cause of your condition. Follow these instructions at home: If you have a soft neck collar:  Wear it as told by your doctor. Remove it only as told by your doctor.  Ask your doctor if you can remove the collar for cleaning  and bathing. If you are allowed to remove the collar for cleaning or bathing: ? Follow instructions from your doctor about how to remove the collar safely. ? Clean the collar by wiping it with mild soap and water and drying it completely. ? Take out any removable pads in the collar every 1-2 days. Wash them by hand with soap and water. Let them air-dry completely before you put them back in the collar. ? Check your skin under the collar for redness or sores. If you see any, tell your doctor. Managing pain      Take over-the-counter and prescription medicines only as told by your doctor.  If told, put ice on the painful area. ? If you have a soft neck collar, remove it as told by your doctor. ? Put ice in a plastic bag. ? Place a towel between your skin and the bag. ? Leave the ice on for 20 minutes, 2-3 times a day.  If using ice does not help, you can try using heat. Use the heat source that your doctor recommends, such as a moist heat pack or a heating pad. ? Place a towel between your skin and the heat source. ? Leave the heat on for 20-30 minutes. ? Remove the heat if your skin turns bright red. This is very important if you are unable to feel pain, heat, or cold. You may have a greater risk of getting burned.  You may try a gentle neck and shoulder rub (massage). Activity  Rest as needed.  Return to your normal activities as told by your doctor. Ask your doctor what activities are safe for you.  Do exercises as told by your doctor or physical therapist.  Do not lift anything that is heavier than 10 lb (4.5 kg) until your doctor tells you that it is safe. General instructions  Use a flat pillow when you sleep.  Do not drive while wearing a soft neck collar. If you do not have a soft neck collar, ask your doctor if it is safe to drive while your neck heals.  Ask your doctor if the medicine prescribed to you requires you to avoid driving or using heavy machinery.  Do not  use any products that contain nicotine or tobacco, such as cigarettes, e-cigarettes, and chewing tobacco. These can delay healing.  If you need help quitting, ask your doctor.  Keep all follow-up visits as told by your doctor. This is important. Contact a doctor if:  Your condition does not get better with treatment. Get help right away if:  Your pain gets worse and is not helped with medicine.  You lose feeling or feel weak in your hand, arm, face, or leg.  You have a high fever.  You have a stiff neck.  You cannot control when you poop or pee (have incontinence).  You have trouble with walking, balance, or talking. Summary  Cervical radiculopathy means that a nerve in the neck is pinched or bruised.  A nerve can get pinched from a bulging disk, arthritis, an injury to the neck, or other causes.  Symptoms include pain, tingling, or loss of feeling that goes from the neck into the arm or hand.  Weakness in your arm or hand can happen in very bad cases.  Treatment may include resting, wearing a soft neck collar, and doing exercises. You might need to take medicines for pain. In very bad cases, shots or surgery may be needed. This information is not intended to replace advice given to you by your health care provider. Make sure you discuss any questions you have with your health care provider. Document Released: 11/21/2011 Document Revised: 10/23/2018 Document Reviewed: 10/23/2018 Elsevier Patient Education  2020 [employer].

## 2019-07-27 NOTE — Progress Notes (Signed)
[facility]    Provider:  Dr Jaynee Eagles Requesting Provider: Melina Schools, MD Primary Care Provider:  Harlan Stains, MD  CC:  Neck pain  HPI:  Suzanne Hicks is a 58 y.o. female here as requested by Harlan Stains, MD, Melina Schools, MD for chronic headaches, hearing loss, vision changes and hand numbness. She is very "on edge", she has severe neck pain, over a year, she went to [facility] and she was sent to Dr. Nelva Bush, he recommended spinal injections and they were not approved. She also saw Dr. Lynann Bologna and he told her the same thing, PT or medications or injections. Dr. Jacelyn Grip had injections approved and they had an entire plan for her. She has tried physical therapy, dry needling and nothing helped. She has taken naproxen. Dumonski had her try meloxicam and she stopped taking it. She has severe neck pain. She has excruciating pain, she has headaches, she doesn't know what kind of headaches they are, tylenol stopped working, she takes excedrin migraine and lays on the ground and it helps. She may have a rebound headaches, she understand rebound headaches and worries about taking it, imitrex helps. It radiates the pain up the back of her head, she has a constant pain radiating up the back of her head. She woke up today and it was worse. She went to a chiropractor and it feels worse she went yesterday. The pain radiates from the back of the neck to the top of the head and she feels it is related to the neck, she is scared she has a brain tumor. No migrainous symptoms, more radiation from the neck to the head. She has trouble with vision, she ca sit and read a book or less and the eyes get blurry until she stops reading and blinks her eyes, blurry vision, It only happens when she is reading a book and she gets diplopia, she can;t focus on a book for 15-20 minutes. When she wakes up she has constant head hurting. She wears hearing aids but denies light and sound sensitivity. She has  hearing loss and she is very upset that I would like to document it and wonders why everything needs to be in the chart. She wakes with numbness in the hands in digits 1-3. She wake sup wit headaches.  Reviewed notes, labs and imaging from outside physicians, which showed:  Reviewed xray dg cervical spines images and agree with the following 08/13/2018:  IMPRESSION: Mild degenerative change without acute abnormality.  CMP 10/2017 normal no recent labs available  I reviewed MRI cervical spine report 11/2018 I dont have images, [facility], which showed multilevel degenerative changes moderate to severe left C5-C6 impinging upon the coursing and exiting left C6 nerve root, moderate severe left C3-C4 impinging upon and displacing coursing and exiting left C4 nerve roots, moderate bilateral C6-C7 with mild/moderate spinal canal stenosis with mild flattening of the cord, mild left and very mild right C4-C5 neural foraminal narrowing, moderate right facet arthrosis with mild active inflammation, degenerative minimal atherosclerosis of C4-C5.  I reviewed ophthalmology notes from [facility].  She has symptoms in both eyes, difficulty focusing and distorted vision, symptoms occur when reading, dryness, diagnosed with myopia with astigmatism and presbyopia bilaterally, dry eyes bilateral, nuclear sclerotic cataract of both eyes.  And they recommended using artificial tears 3-4 times a day.  Review of Systems: Patient complains of symptoms per HPI as well as the following symptoms: Blurred vision, hearing loss, headache, numbness, not enough sleep. Pertinent  negatives and positives per HPI. All others negative.   Social History   Socioeconomic History   Marital status: Married    Spouse name: Not on file   Number of children: 2   Years of education: Not on file   Highest education level: Not on file  Occupational History   Not on file  Social Needs   Financial resource strain: Not on file     Food insecurity    Worry: Not on file    Inability: Not on file   Transportation needs    Medical: Not on file    Non-medical: Not on file  Tobacco Use   Smoking status: Never Smoker   Smokeless tobacco: Never Used  Substance and Sexual Activity   Alcohol use: Yes    Alcohol/week: 0.0 standard drinks    Comment: socially   Drug use: Never   Sexual activity: Yes    Partners: Male    Birth control/protection: None, Other-see comments    Comment: husband vasectomy, hysterectomy  Lifestyle   Physical activity    Days per week: Not on file    Minutes per session: Not on file   Stress: Not on file  Relationships   Social connections    Talks on phone: Not on file    Gets together: Not on file    Attends religious service: Not on file    Active member of club or organization: Not on file    Attends meetings of clubs or organizations: Not on file    Relationship status: Not on file   Intimate partner violence    Fear of current or ex partner: Not on file    Emotionally abused: Not on file    Physically abused: Not on file    Forced sexual activity: Not on file  Other Topics Concern   Not on file  Social History Narrative   Lives at home with her husband    Family History  Problem Relation Age of Onset   Breast cancer Mother 51       Recurrence   Other Mother        memory issue, had one seizure   Osteoporosis Mother    Alzheimer's disease Mother    Liver cancer Mother    Cancer Father        Prostate   High blood pressure Father    Other Father        neuropathy   GER disease Father        "vocal cord numbing"   Cancer Paternal Grandmother        Dwaine Gale cell-skin   Stroke Paternal Grandfather    Breast cancer Other 16       maternal cousin-bilateral mastectomy-fast growing   Bladder Cancer Maternal Grandmother        d. 74s   Lymphoma Paternal Aunt     Past Medical History:  Diagnosis Date   Anemia    Anxiety    xanax    Bicornate uterus    Family history of breast cancer    Generalized headaches    imitrex/tylenol    Headache    Hearing loss    bilateral - has hearing aids   History of melanoma    Insomnia    Melanoma ([place])    back of left leg   Skin cancer    Melanoma at 41    Patient Active Problem List   Diagnosis Date Noted   Genetic testing 12/03/2018  Skin cancer    History of melanoma    Family history of breast cancer    Sensory hearing loss, bilateral 08/16/2016   Hearing loss 10/20/2015    Past Surgical History:  Procedure Laterality Date   [place]   CYSTOSCOPY  07/23/2013   Procedure: CYSTOSCOPY;  Surgeon: Reece Packer, MD;  Location: [facility];  Service: Urology;;   DILATION AND CURETTAGE OF UTERUS     MAB x 3   LAPAROSCOPIC TOTAL HYSTERECTOMY  12/14   at [place]  07/23/2013   Procedure: LAPAROSCOPY DIAGNOSTIC;  Surgeon: Lyman Speller, MD;  Location: [facility];  Service: Gynecology;;   SKIN BIOPSY      Current Outpatient Medications  Medication Sig Dispense Refill   aspirin-acetaminophen-caffeine (EXCEDRIN MIGRAINE) 062-694-85 MG per tablet Take 2 tablets by mouth as needed.      Calcium-Phosphorus-Vitamin D (CITRACAL +D3 PO) Take 1 each by mouth daily.     ibuprofen (ADVIL,MOTRIN) 200 MG tablet Take 600 mg by mouth as needed.      meloxicam (MOBIC) 15 MG tablet Take 15 mg by mouth daily as needed for pain.     Multiple Vitamins-Minerals (ONE-A-DAY WOMENS PO) Take by mouth.     SUMAtriptan (IMITREX) 100 MG tablet TAKE ONE TABLET AT ONSET OF HEADACHE. MAY REPEAT IN TWO HOURS IF NEEDED. 9 tablet 12   cyclobenzaprine (FLEXERIL) 10 MG tablet Take 10 mg by mouth 3 (three) times daily as needed.  0   naproxen (NAPROSYN) 500 MG tablet daily as needed.  1   NONFORMULARY OR COMPOUNDED ITEM Vit E 200u/ml suppository.  Place vaginally three times weekly. (Patient not taking: Reported on 07/27/2019) 36  each 4   No current facility-administered medications for this visit.     Allergies as of 07/27/2019   (No Known Allergies)    Vitals: BP 102/76 (BP Location: Right Arm, Patient Position: Sitting)    Pulse 76    Temp (!) 97.5 F (36.4 C) Comment: taken by front staff   Ht 5' 8.5" (1.74 m)    Wt 160 lb (72.6 kg) Comment: pt reported   LMP 10/31/2013    BMI 23.97 kg/m  Last Weight:  Wt Readings from Last 1 Encounters:  07/27/19 160 lb (72.6 kg)   Last Height:   Ht Readings from Last 1 Encounters:  07/27/19 5' 8.5" (1.74 m)     Physical exam: Exam: Gen: NAD, conversant, well nourised, obese, well groomed                     CV: RRR, no MRG. No Carotid Bruits. No peripheral edema, warm, nontender Eyes: Conjunctivae clear without exudates or hemorrhage  Neuro: Detailed Neurologic Exam  Speech:    Speech is normal; fluent and spontaneous with normal comprehension.  Cognition:    The patient is oriented to person, place, and time;     recent and remote memory intact;     language fluent;     normal attention, concentration,     fund of knowledge Cranial Nerves:    The pupils are equal, round, and reactive to light. The fundi are normal and spontaneous venous pulsations are present. Visual fields are full to finger confrontation. Extraocular movements are intact. Trigeminal sensation is intact and the muscles of mastication are normal. The face is symmetric. The palate elevates in the midline. Hearing intact to voice. Voice is normal. Shoulder shrug is  normal. The tongue has normal motion without fasciculations.   Coordination:    Normal finger to nose and heel to shin. Normal rapid alternating movements.   Gait:    Heel-toe and tandem gait are normal.   Motor Observation:    No asymmetry, no atrophy, and no involuntary movements noted. Tone:    Normal muscle tone.    Posture:    Posture is normal. normal erect    Strength:    Strength is V/V in the upper and lower  limbs.      Sensation: intact to LT     Reflex Exam:  DTR's:    Deep tendon reflexes in the upper and lower extremities are normal bilaterally.   Toes:    The toes are downgoing bilaterally.   Clonus:    Clonus is absent.    Assessment/Plan:  58 year old with chronic neck pain with cervicalgia.  She has multilevel cervical degenerative disease and she has been to several orthopaedists  And had MRI cervical spine. She needs to follow up with ortho for her neck pain and degen changes. For her other symptoms will complete testing with MRI brain however she has been diagnosed with dry eyes and I do believe this is the issue with her eyes but cannot rule out neuro causes because of numbness in hands, hearing loss and other symptoms will check MRI brain.   MRI of the brain: MRI brain due to concerning symptoms of morning headaches, positional headaches,vision changes and diplopia, numbness in the right hand, hearing loss  to look for space occupying mass, chiari or intracranial hypertension (pseudotumor), schwannoma, demyelinating lesions or other intracranial problems.  Patient appeared uncomfortable with me, she repeatedly questioned things I was documenting in her chart, she asked me not to document pertinent findings and symptoms she mentioned (and circled on ROS) such as her hearing loss in my note, I did tell her that these are things that I felt were important medically legally to document and she stated it was upsetting to her that  I am making her medical records available to her other doctors and maybe she does not want her medications or some medical conditions shared, that the system where we share medical information via our electronic systems is concerning and asked why we can go back to paper charting.  I do not think that the patient-doctor relationship was effective and she may need to see a different neurologist in the future who may provide better care through a more effective working  relationship with this patient.   Orders Placed This Encounter  Procedures   MR BRAIN W WO CONTRAST   Basic Metabolic Panel     Cc: Harlan Stains, MD,  Melina Schools, MD  Sarina Ill, MD  [facility] [address] [place], [place] [address]  Phone [phone] Fax [phone]

## 2019-07-27 NOTE — Telephone Encounter (Signed)
During check-in process patient was very difficult, questioning our process every step of the way. I had to repeat the COVID questionaire 4 times she did not understand the question did she have a fever or a cough and I had to demonstrate touching the forehead and fake cough for her. When asked for insurance cards and license, she refused to hand over as she did not want me touching them. Refused to use the hand sanitizer to disinfect her cards saying it would ruin them. Requested to wait in her car to be called by nurse; however, argued that she did not want anyone calling her cell phone only the doctor.  [phone]

## 2019-07-27 NOTE — Telephone Encounter (Signed)
I spoke with the pt about getting her labs. She is unable to come this afternoon but will come tomorrow morning. Pt understands she can check-in for lab as early as 7:30 and lab will open at 8 AM. She prefers to wait in the car. She verbalized appreciation for the call.

## 2019-07-28 ENCOUNTER — Other Ambulatory Visit (INDEPENDENT_AMBULATORY_CARE_PROVIDER_SITE_OTHER): Payer: Self-pay

## 2019-07-28 ENCOUNTER — Other Ambulatory Visit: Payer: Self-pay

## 2019-07-28 DIAGNOSIS — M542 Cervicalgia: Secondary | ICD-10-CM | POA: Insufficient documentation

## 2019-07-28 DIAGNOSIS — Z0289 Encounter for other administrative examinations: Secondary | ICD-10-CM

## 2019-07-28 DIAGNOSIS — G4489 Other headache syndrome: Secondary | ICD-10-CM | POA: Diagnosis not present

## 2019-07-29 ENCOUNTER — Telehealth: Payer: Self-pay | Admitting: *Deleted

## 2019-07-29 DIAGNOSIS — M50321 Other cervical disc degeneration at C4-C5 level: Secondary | ICD-10-CM | POA: Diagnosis not present

## 2019-07-29 DIAGNOSIS — M50221 Other cervical disc displacement at C4-C5 level: Secondary | ICD-10-CM | POA: Diagnosis not present

## 2019-07-29 DIAGNOSIS — M542 Cervicalgia: Secondary | ICD-10-CM | POA: Diagnosis not present

## 2019-07-29 DIAGNOSIS — R202 Paresthesia of skin: Secondary | ICD-10-CM | POA: Diagnosis not present

## 2019-07-29 DIAGNOSIS — M791 Myalgia, unspecified site: Secondary | ICD-10-CM | POA: Diagnosis not present

## 2019-07-29 LAB — BASIC METABOLIC PANEL
BUN/Creatinine Ratio: 32 — ABNORMAL HIGH (ref 12–28)
BUN: 19 mg/dL (ref 8–27)
CO2: 22 mmol/L (ref 20–29)
Calcium: 8.9 mg/dL (ref 8.7–10.3)
Chloride: 105 mmol/L (ref 96–106)
Creatinine, Ser: 0.59 mg/dL (ref 0.57–1.00)
GFR calc Af Amer: 115 mL/min/{1.73_m2} (ref >59)
GFR calc non Af Amer: 100 mL/min/{1.73_m2} (ref >59)
Glucose: 74 mg/dL (ref 65–99)
Potassium: 5.1 mmol/L (ref 3.5–5.2)
Sodium: 143 mmol/L (ref 134–144)

## 2019-07-29 NOTE — Telephone Encounter (Signed)
Spoke with pt and advised her labs were fine. Dr. Jaynee Eagles reviewed them and did not have any concerns. Patient verbalized understanding and appreciation. She would like to be involved with the MRI scheduling. I advised after the authorization is complete she will receive a call to schedule. She verbalized appreciation.

## 2019-07-29 NOTE — Telephone Encounter (Signed)
-----   Message from Melvenia Beam, MD sent at 07/29/2019  9:22 AM EDT ----- Unremarkable thanks

## 2019-08-02 ENCOUNTER — Telehealth: Payer: Self-pay | Admitting: Neurology

## 2019-08-02 DIAGNOSIS — M50221 Other cervical disc displacement at C4-C5 level: Secondary | ICD-10-CM | POA: Diagnosis not present

## 2019-08-02 DIAGNOSIS — M542 Cervicalgia: Secondary | ICD-10-CM | POA: Diagnosis not present

## 2019-08-02 DIAGNOSIS — M791 Myalgia, unspecified site: Secondary | ICD-10-CM | POA: Diagnosis not present

## 2019-08-02 DIAGNOSIS — M50321 Other cervical disc degeneration at C4-C5 level: Secondary | ICD-10-CM | POA: Diagnosis not present

## 2019-08-02 DIAGNOSIS — R202 Paresthesia of skin: Secondary | ICD-10-CM | POA: Diagnosis not present

## 2019-08-02 NOTE — Telephone Encounter (Signed)
no to the covid-19 questions MR Brain w/wo contrast Dr. Ihor Dow Auth: [id number] (exp. 08/02/19 to 08/31/19). Patient is scheduled at [facility] for 08/04/19.

## 2019-08-04 ENCOUNTER — Other Ambulatory Visit: Payer: Self-pay

## 2019-08-04 ENCOUNTER — Telehealth: Payer: Self-pay | Admitting: *Deleted

## 2019-08-04 ENCOUNTER — Ambulatory Visit (INDEPENDENT_AMBULATORY_CARE_PROVIDER_SITE_OTHER): Payer: BC Managed Care – PPO

## 2019-08-04 DIAGNOSIS — M50321 Other cervical disc degeneration at C4-C5 level: Secondary | ICD-10-CM | POA: Diagnosis not present

## 2019-08-04 DIAGNOSIS — M791 Myalgia, unspecified site: Secondary | ICD-10-CM | POA: Diagnosis not present

## 2019-08-04 DIAGNOSIS — H532 Diplopia: Secondary | ICD-10-CM

## 2019-08-04 DIAGNOSIS — M50221 Other cervical disc displacement at C4-C5 level: Secondary | ICD-10-CM | POA: Diagnosis not present

## 2019-08-04 DIAGNOSIS — R202 Paresthesia of skin: Secondary | ICD-10-CM | POA: Diagnosis not present

## 2019-08-04 MED ORDER — GADOBENATE DIMEGLUMINE 529 MG/ML IV SOLN
15.0000 mL | Freq: Once | INTRAVENOUS | Status: AC | PRN
  Administered 2019-08-04: 15 mL via INTRAVENOUS

## 2019-08-04 NOTE — Telephone Encounter (Signed)
Patient arrived for her MRI and initially refused to come in to check in.  She finally came in to do the COVID screening questions and get her temp taken.  She then ran out.  Katie went to get her to tell her she needs to go to the front desk to complete check in and pay.  She again refused. I called her and asked her to come in as we need her to sign the AOB and pay.  She came in and signed but refused to pay.  When I explained we need her to make some payment toward the scan she became very upset and asked to speak to the manager.  Angie, the manager, was tied up on the phone and unable to come speak to her. Per Angie  we will do scan and bill her.  I called patient to make her aware.  She will wait in her car for MRI staff to call her when they are ready to start her scan.  She verbalized understanding but was very upset that her scan was in a "trailer". States this is not Therapist, sports and she was not happy at all.

## 2019-08-04 NOTE — Telephone Encounter (Signed)
Let's discuss, I don't think there is a good relationship between patient and office and patient and physician please see my note and telephone notes from prior incidents with staff.

## 2019-08-09 ENCOUNTER — Encounter: Payer: Self-pay | Admitting: Neurology

## 2019-08-09 DIAGNOSIS — M50321 Other cervical disc degeneration at C4-C5 level: Secondary | ICD-10-CM | POA: Diagnosis not present

## 2019-08-09 DIAGNOSIS — M50221 Other cervical disc displacement at C4-C5 level: Secondary | ICD-10-CM | POA: Diagnosis not present

## 2019-08-09 DIAGNOSIS — M791 Myalgia, unspecified site: Secondary | ICD-10-CM | POA: Diagnosis not present

## 2019-08-09 DIAGNOSIS — R202 Paresthesia of skin: Secondary | ICD-10-CM | POA: Diagnosis not present

## 2019-08-10 NOTE — Telephone Encounter (Signed)
I called the pt back to let her know MRI brain was normal. I LVM (unable to leave details) asking for a call back. When the patient calls back, please let her know that her MRI brain is normal.   --------------- Notes recorded by Melvenia Beam, MD on 08/06/2019 at 12:18 PM EDT  Mri brain normal thanks

## 2019-08-10 NOTE — Telephone Encounter (Signed)
Pt states she missed a call from [employer] last week.  Pt is asking for a call from [facility] before 1 pm(becuase of a medical appointment pt has).  Pt told that would be documented but that she would need to be mindful RN is with pt's throughout the day and that RN's respond to messages just as soon as they are available within 24-48 hours

## 2019-08-11 NOTE — Telephone Encounter (Addendum)
I spoke with the patient. She was advised her MRI brain is normal. She verbalized understanding and was upset that nobody medical had called her. I advised it can take a week or so to get the MRI results and the results came in over the weekend. The pt asked for a copy of the results to be mailed to her. I advised I would let medical records know as we require a release due to hipaa. The patient expressed her dissatisfaction and stated that every other doctor's office sends results, labs results all the time and she hasn't had to do that. During the call she expressed she had to get ready in 4 minutes. I then advised a manager would be calling her later today and she asked why I said that. I advised she had requested a manager call back. Pt said it was because of the wait she had this morning on the phone for 15 minutes. Pt made aware the phone lines open at 8 AM and are busy at different times of the day and phone staffing is also short today. She then told me I done nothing wrong which I didn't quite understand because our conversation was related to office policies and procedures and following up on her call earlier this morning requesting for management to call her.

## 2019-08-11 NOTE — Telephone Encounter (Signed)
Suzanne Hicks, patient has been dismissed. I'm not sure she received the letter yet but she is asking to talk to you because she is dissatisfied with waiting on the phone. Thank you.

## 2019-08-11 NOTE — Telephone Encounter (Addendum)
Pt has called 1st expressing her dissatisfaction in holding on the phone line for 15 mins.Pt was reminded that in the beginning of the call phone rep stated thank you for your patience in holding.  She was advised the phone staff was handling each call as quickly as possible. Pt expressed her dissatisfaction further and then asked to speak with RN Romelle Starcher, phone rep began reading message to pt from [employer].  Pt told phone rep she is just the person who answers the phone and she wants to speak with someone medical.  Pt was told that the RN asked the message be relayed to her.  Pt went on to say she needs to speak with a medical person and did not like being interrupted.  Pt is also asking for a call from an office manager to discuss her dissatisfaction in the call with the phone rep. Please call her between now and 9:10 or 11:20-12:20 or 1:45-3:15pm

## 2019-08-11 NOTE — Telephone Encounter (Signed)
I spoke with Suzanne Hicks regarding her wait on the phones. She also has requested the MRI report and disk. I will speak with [name] regarding this.

## 2019-08-11 NOTE — Telephone Encounter (Signed)
I mailed mri results and pt Cd out to home address on 08/11/19

## 2019-08-11 NOTE — Telephone Encounter (Signed)
I advised patient to join mychart when I reviewed the AVS with her at our appointment. If she was on mychart I would have released the information as soon as I had it as I reviewed her results as soon as I had them. At the time, patient expressed dissatisfaction of EMRs in general, said she wanted a paper chart and was upset her information was in Epic so she refused to join [facility] - which is why I could not release results to her as soon as I had them. Thanks.

## 2019-08-24 ENCOUNTER — Other Ambulatory Visit: Payer: Self-pay

## 2019-08-25 NOTE — Progress Notes (Deleted)
58 y.o. [id number] Married White or Caucasian female here for annual exam.    Patient's last menstrual period was 10/31/2013.          Sexually active: {yes no:314532}  The current method of family planning is {contraception:315051}.    Exercising: {yes no:314532}  {types:19826} Smoker:  {YES V2345720  Health Maintenance: Pap: 11/11/16 neg   11/23/13 History of abnormal Pap:  yes MMG:  11/26/18 bi-rads 1 neg  Colonoscopy:  12/05/14 F/U 5 years  BMD:   *** TDaP:  2011 Pneumonia vaccine(s):  NA  Shingrix:   no Hep C testing: no Screening Labs: ***   reports that she has never smoked. She has never used smokeless tobacco. She reports current alcohol use. She reports that she does not use drugs.  Past Medical History:  Diagnosis Date  . Anemia   . Anxiety    xanax  . Bicornate uterus   . Family history of breast cancer   . Generalized headaches    imitrex/tylenol   . Headache   . Hearing loss    bilateral - has hearing aids  . History of melanoma   . Insomnia   . Melanoma ([place])    back of left leg  . Skin cancer    Melanoma at 86    Past Surgical History:  Procedure Laterality Date  . CESAREAN SECTION  1993  . CESAREAN SECTION  1998  . CYSTOSCOPY  07/23/2013   Procedure: CYSTOSCOPY;  Surgeon: Reece Packer, MD;  Location: [facility];  Service: Urology;;  . Brigitte Pulse AND CURETTAGE OF UTERUS     MAB x 3  . LAPAROSCOPIC TOTAL HYSTERECTOMY  12/14   at [facility]  . LAPAROSCOPY  07/23/2013   Procedure: LAPAROSCOPY DIAGNOSTIC;  Surgeon: Lyman Speller, MD;  Location: [facility];  Service: Gynecology;;  . SKIN BIOPSY      Current Outpatient Medications  Medication Sig Dispense Refill  . aspirin-acetaminophen-caffeine (EXCEDRIN MIGRAINE) 250-250-65 MG per tablet Take 2 tablets by mouth as needed.     . Calcium-Phosphorus-Vitamin D (CITRACAL +D3 PO) Take 1 each by mouth daily.    . cyclobenzaprine (FLEXERIL) 10 MG tablet Take 10 mg by mouth 3 (three) times daily as needed.  0  .  ibuprofen (ADVIL,MOTRIN) 200 MG tablet Take 600 mg by mouth as needed.     . meloxicam (MOBIC) 15 MG tablet Take 15 mg by mouth daily as needed for pain.    . Multiple Vitamins-Minerals (ONE-A-DAY WOMENS PO) Take by mouth.    . naproxen (NAPROSYN) 500 MG tablet daily as needed.  1  . NONFORMULARY OR COMPOUNDED ITEM Vit E 200u/ml suppository.  Place vaginally three times weekly. (Patient not taking: Reported on 07/27/2019) 36 each 4  . SUMAtriptan (IMITREX) 100 MG tablet TAKE ONE TABLET AT ONSET OF HEADACHE. MAY REPEAT IN TWO HOURS IF NEEDED. 9 tablet 12   No current facility-administered medications for this visit.     Family History  Problem Relation Age of Onset  . Breast cancer Mother 40       Recurrence  . Other Mother        memory issue, had one seizure  . Osteoporosis Mother   . Alzheimer's disease Mother   . Liver cancer Mother   . Cancer Father        Prostate  . High blood pressure Father   . Other Father        neuropathy  . GER disease Father        "  vocal cord numbing"  . Cancer Paternal Grandmother        Basil cell-skin  . Stroke Paternal Grandfather   . Breast cancer Other 57       maternal cousin-bilateral mastectomy-fast growing  . Bladder Cancer Maternal Grandmother        d. 3s  . Lymphoma Paternal Aunt     Review of Systems  Exam:   LMP 10/31/2013   Height:      Ht Readings from Last 3 Encounters:  07/27/19 5' 8.5" (1.74 m)  09/11/18 5\' 8"  (1.727 m)  10/24/17 5\' 8"  (1.727 m)    General appearance: alert, cooperative and appears stated age Head: Normocephalic, without obvious abnormality, atraumatic Neck: no adenopathy, supple, symmetrical, trachea midline and thyroid {EXAM; THYROID:18604} Lungs: clear to auscultation bilaterally Breasts: {Exam; breast:13139::"normal appearance, no masses or tenderness"} Heart: regular rate and rhythm Abdomen: soft, non-tender; bowel sounds normal; no masses,  no organomegaly Extremities: extremities normal,  atraumatic, no cyanosis or edema Skin: Skin color, texture, turgor normal. No rashes or lesions Lymph nodes: Cervical, supraclavicular, and axillary nodes normal. No abnormal inguinal nodes palpated Neurologic: Grossly normal   Pelvic: External genitalia:  no lesions              Urethra:  normal appearing urethra with no masses, tenderness or lesions              Bartholins and Skenes: normal                 Vagina: normal appearing vagina with normal color and discharge, no lesions              Cervix: {exam; cervix:14595}              Pap taken: {yes no:314532} Bimanual Exam:  Uterus:  {exam; uterus:12215}              Adnexa: {exam; adnexa:12223}               Rectovaginal: Confirms               Anus:  normal sphincter tone, no lesions  Chaperone was present for exam.  A:  Well Woman with normal exam  P:   {plan; gyn:5269::"mammogram","pap smear","return annually or prn"}

## 2019-08-27 ENCOUNTER — Ambulatory Visit: Payer: BC Managed Care – PPO | Admitting: Obstetrics & Gynecology

## 2019-09-01 ENCOUNTER — Telehealth: Payer: Self-pay | Admitting: Obstetrics & Gynecology

## 2019-09-01 NOTE — Telephone Encounter (Signed)
Patient has an aex appointment 09/03/19 with Dr.Miller. She has a concern about covid-19 and her workplace. She "wants to make sure she is doing the right thing". She is asking to talk with Dr.Miller directly. 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

## 2019-09-01 NOTE — Telephone Encounter (Signed)
Spoke with patient. Advised Dr. Sabra Heck is out of the office today, will forward for her to review on 9/17 when she returns. Patient repeated multiple times her availability for today and is requesting Dr. Sabra Heck return call to discuss Covid19 concerns at her workplace. Patient is scheduled for AEX on 09/03/19, expressed that her concerns need to be discussed today. Again advised patient that Dr. Sabra Heck is out of the office today, I will forward message to her to review, I can not guarantee return call today since Dr. Sabra Heck is out of the office.  Patient declines to discuss with RN.   Routing to Dr. Sabra Heck

## 2019-09-03 ENCOUNTER — Ambulatory Visit: Payer: BC Managed Care – PPO | Admitting: Obstetrics & Gynecology

## 2019-09-03 NOTE — Telephone Encounter (Signed)
Spoke with pt personally on day of pt's phone call.  She had exposure at work but is asymptomatic.  Has appt 09/03/2019.  This is cancelled and rescheduled to one week later.  Advised pt of how to do testing if desired.  If pt does have testing and it is not finalized by Friday, 09/10/2019, she is to call before her date of appt to reschedule.

## 2019-09-03 NOTE — Telephone Encounter (Signed)
Encounter closed

## 2019-09-09 DIAGNOSIS — Z23 Encounter for immunization: Secondary | ICD-10-CM | POA: Diagnosis not present

## 2019-09-09 NOTE — Progress Notes (Signed)
58 y.o. [id number] Married White or Caucasian female here for annual exam.   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    Denies vaginal bleeding.  Hasn't been using the vit E vaginal suppositories.    Son is back in [place] working on his 5th year of his dual degree.  He's doing all work on-line.  He is working on a dual Media planner.    Patient's last menstrual period was 10/31/2013.          Sexually active: Yes.    The current method of family planning is status post hysterectomy.    Exercising: Yes.     Smoker:  no  Health Maintenance: Pap:  11/11/16 neg   11/23/13 neg  History of abnormal Pap:  yes MMG:  11/26/18 MRI Bilateral BIRADS1:neg  Colonoscopy:  12/05/14 normal. F/u 5 years  BMD:   10/30/18 Osteopenia  TDaP:  2011 Pneumonia vaccine(s):  n/a Shingrix:   No Hep C testing: completed  Screening Labs: going to Dr. Dema Severin   reports that she has never smoked. She has never used smokeless tobacco. She reports current alcohol use. She reports that she does not use drugs.  Past Medical History:  Diagnosis Date  . Anemia   . Anxiety    xanax  . Bicornate uterus   . Family history of breast cancer   . Generalized headaches    imitrex/tylenol   . Headache   . Hearing loss    bilateral - has hearing aids  . History of melanoma   . Insomnia   . Melanoma ([place])    back of left leg  . Skin cancer    Melanoma at 72    Past Surgical History:  Procedure Laterality Date  . CESAREAN SECTION  1993  . CESAREAN SECTION  1998  . CYSTOSCOPY  07/23/2013   Procedure: CYSTOSCOPY;  Surgeon: Reece Packer, MD;  Location: [facility];  Service: Urology;;  . Brigitte Pulse AND CURETTAGE OF UTERUS     MAB x 3  . LAPAROSCOPIC TOTAL HYSTERECTOMY  12/14   at [facility]  . LAPAROSCOPY  07/23/2013   Procedure: LAPAROSCOPY DIAGNOSTIC;  Surgeon: Lyman Speller, MD;  Location: [facility];  Service: Gynecology;;  . SKIN BIOPSY      Current Outpatient Medications  Medication Sig Dispense  Refill  . aspirin-acetaminophen-caffeine (EXCEDRIN MIGRAINE) 250-250-65 MG per tablet Take 2 tablets by mouth as needed.     . Calcium-Phosphorus-Vitamin D (CITRACAL +D3 PO) Take 1 each by mouth daily.    Marland Kitchen ibuprofen (ADVIL,MOTRIN) 200 MG tablet Take 600 mg by mouth as needed.     . meloxicam (MOBIC) 15 MG tablet Take 15 mg by mouth daily as needed for pain.    . Multiple Vitamins-Minerals (ONE-A-DAY WOMENS PO) Take by mouth.    . SUMAtriptan (IMITREX) 100 MG tablet TAKE ONE TABLET AT ONSET OF HEADACHE. MAY REPEAT IN TWO HOURS IF NEEDED. 9 tablet 12  . NONFORMULARY OR COMPOUNDED ITEM Vit E 200u/ml suppository.  Place vaginally three times weekly. (Patient not taking: Reported on 07/27/2019) 36 each 4   No current facility-administered medications for this visit.     Family History  Problem Relation Age of Onset  . Breast cancer Mother 45       Recurrence  . Other Mother        memory issue, had one seizure  . Osteoporosis Mother   . Alzheimer's disease Mother   . Liver cancer Mother   .  Cancer Father        Prostate  . High blood pressure Father   . Other Father        neuropathy  . GER disease Father        "vocal cord numbing"  . Cancer Paternal Grandmother        Basil cell-skin  . Stroke Paternal Grandfather   . Breast cancer Other 57       maternal cousin-bilateral mastectomy-fast growing  . Bladder Cancer Maternal Grandmother        d. 75s  . Lymphoma Paternal Aunt     Review of Systems  All other systems reviewed and are negative.   Exam:   BP 110/70   Pulse 84   Temp 97.7 F (36.5 C) (Temporal)   Resp 16   LMP 10/31/2013       Ht Readings from Last 3 Encounters:  07/27/19 5' 8.5" (1.74 m)  09/11/18 5\' 8"  (1.727 m)  10/24/17 5\' 8"  (1.727 m)    General appearance: alert, cooperative and appears stated age Head: Normocephalic, without obvious abnormality, atraumatic Neck: no adenopathy, supple, symmetrical, trachea midline and thyroid normal to inspection  and palpation Lungs: clear to auscultation bilaterally Breasts: normal appearance, no masses or tenderness Heart: regular rate and rhythm Abdomen: soft, non-tender; bowel sounds normal; no masses,  no organomegaly Extremities: extremities normal, atraumatic, no cyanosis or edema Skin: Skin color, texture, turgor normal. No rashes or lesions Lymph nodes: Cervical, supraclavicular, and axillary nodes normal. No abnormal inguinal nodes palpated Neurologic: Grossly normal   Pelvic: External genitalia:  no lesions              Urethra:  normal appearing urethra with no masses, tenderness or lesions              Bartholins and Skenes: normal                 Vagina: normal appearing vagina with normal color and discharge, no lesions              Cervix: absent              Pap taken: No. Bimanual Exam:  Uterus:  uterus absent              Adnexa: no mass, fullness, tenderness               Rectovaginal: Confirms               Anus:  normal sphincter tone, no lesions  Procedure:  Area cleansed with Betadine x 3.  1.5cc1% Lidocaine instilled beneath lesion.  Lot:  [id number].  Exp: 03/2021.  Using sterile technique, the lesion was fully excised and sent to pathology.  Silver nitrate used for excellent hemostasis.    Chaperone was present for exam.  A:  Well Woman with normal exam PMP, no HRT H/O TLH/bilateral salpingectomy/cystoscopy with  Adhesions due to prior cesarean sections and h/o bicornuate uterus Lifetime risk of >20% of breast cancer  P:   Doing yearly MMG and breast MRI.  She will do these both before the end of the year. pap smear not recommended Colonoscopy is due.  She is aware.  Spoke with Dr. Dema Severin about this.  Decided to postpone this until January. Will plan lab work next month with Dr. Dema Severin Return annually or prn

## 2019-09-10 ENCOUNTER — Other Ambulatory Visit: Payer: Self-pay

## 2019-09-10 ENCOUNTER — Other Ambulatory Visit: Payer: Self-pay | Admitting: Obstetrics & Gynecology

## 2019-09-10 ENCOUNTER — Ambulatory Visit (INDEPENDENT_AMBULATORY_CARE_PROVIDER_SITE_OTHER): Payer: BC Managed Care – PPO | Admitting: Obstetrics & Gynecology

## 2019-09-10 ENCOUNTER — Encounter: Payer: Self-pay | Admitting: Obstetrics & Gynecology

## 2019-09-10 VITALS — BP 110/70 | HR 84 | Temp 97.7°F | Resp 16

## 2019-09-10 DIAGNOSIS — Z01419 Encounter for gynecological examination (general) (routine) without abnormal findings: Secondary | ICD-10-CM | POA: Diagnosis not present

## 2019-09-10 DIAGNOSIS — N9089 Other specified noninflammatory disorders of vulva and perineum: Secondary | ICD-10-CM | POA: Diagnosis not present

## 2019-09-10 NOTE — Patient Instructions (Signed)
Dr. Lizbeth Bark at [facility] Dr. Silvano Rusk at [place]

## 2019-09-13 ENCOUNTER — Other Ambulatory Visit: Payer: Self-pay | Admitting: Obstetrics & Gynecology

## 2019-09-13 DIAGNOSIS — Z1231 Encounter for screening mammogram for malignant neoplasm of breast: Secondary | ICD-10-CM

## 2019-09-20 ENCOUNTER — Other Ambulatory Visit: Payer: Self-pay | Admitting: *Deleted

## 2019-09-20 DIAGNOSIS — Z20828 Contact with and (suspected) exposure to other viral communicable diseases: Secondary | ICD-10-CM | POA: Diagnosis not present

## 2019-09-20 DIAGNOSIS — Z20822 Contact with and (suspected) exposure to covid-19: Secondary | ICD-10-CM

## 2019-09-21 ENCOUNTER — Telehealth: Payer: Self-pay

## 2019-09-21 NOTE — Telephone Encounter (Signed)
Patient called and was upset with being dismissed. She is embarrassed that she got dismissed. She wants to do the right thing and is sorry if she came across wrong or in a bad way. She hates that this has happened. She would like a call from [facility] or Dr. Jaynee Eagles to hopefully explain this. She does not want anyone to think she is a bad person.

## 2019-09-21 NOTE — Telephone Encounter (Signed)
Angie already spoke with patient and angie asked [place] if she could have another appointment, angie let her know we still thought she may be better taken care of by another practice. We absolutely do not think she is a bad person at all! We wish her well. Sometimes things don;t work out or click with one practice but there are other practices that may work better. We really wish her well, no hard feelings whatsoever! Would you please call her back and let her know, I have patients all day long thanks

## 2019-09-22 LAB — NOVEL CORONAVIRUS, NAA: SARS-CoV-2, NAA: NOT DETECTED

## 2019-10-01 DIAGNOSIS — Z8739 Personal history of other diseases of the musculoskeletal system and connective tissue: Secondary | ICD-10-CM | POA: Diagnosis not present

## 2019-10-01 DIAGNOSIS — M47812 Spondylosis without myelopathy or radiculopathy, cervical region: Secondary | ICD-10-CM | POA: Diagnosis not present

## 2019-10-01 DIAGNOSIS — G894 Chronic pain syndrome: Secondary | ICD-10-CM | POA: Diagnosis not present

## 2019-10-04 ENCOUNTER — Ambulatory Visit
Admission: RE | Admit: 2019-10-04 | Discharge: 2019-10-04 | Disposition: A | Discharge status: Home / Self Care | Payer: BC Managed Care – PPO | Source: Ambulatory Visit | Attending: Obstetrics & Gynecology | Admitting: Obstetrics & Gynecology

## 2019-10-04 ENCOUNTER — Other Ambulatory Visit: Payer: Self-pay

## 2019-10-04 DIAGNOSIS — Z1231 Encounter for screening mammogram for malignant neoplasm of breast: Secondary | ICD-10-CM | POA: Diagnosis not present

## 2019-10-05 ENCOUNTER — Telehealth: Payer: Self-pay | Admitting: Obstetrics & Gynecology

## 2019-10-05 DIAGNOSIS — Z1231 Encounter for screening mammogram for malignant neoplasm of breast: Secondary | ICD-10-CM

## 2019-10-05 DIAGNOSIS — Z9189 Other specified personal risk factors, not elsewhere classified: Secondary | ICD-10-CM

## 2019-10-05 DIAGNOSIS — Z803 Family history of malignant neoplasm of breast: Secondary | ICD-10-CM

## 2019-10-05 NOTE — Telephone Encounter (Signed)
-----   Message from Polly Cobia, [place] sent at 10/05/2019 11:29 AM EDT ----- Routed to triage

## 2019-10-05 NOTE — Telephone Encounter (Signed)
Patient calling to schedule MRI for mid-December (14th or 15th). Can be reached today until 2 pm or tomorrow morning between 8-9.

## 2019-10-06 NOTE — Telephone Encounter (Signed)
Notes recorded by Megan Salon, MD on 10/05/2019 at 10:06 AM EDT  Pt has increased personal risk for breast cancer. Needs breast MRI scheduled. She's met her deductible so wants this done before the end of the year. Routine to [facility] as well.   Order placed for MRI Breast bilateral w/wo contrast to [facility].   Spoke with patient. Advised Order has been placed to [facility], they will contact you directly to schedule. ONce MRI is scheduled our office will precert. Patient verbalizes understanding and is agreeable.   Routing to provider for final review. Patient is agreeable to disposition. Will close encounter.   Cc: Lerry Liner

## 2019-10-11 DIAGNOSIS — M47812 Spondylosis without myelopathy or radiculopathy, cervical region: Secondary | ICD-10-CM | POA: Diagnosis not present

## 2019-10-12 DIAGNOSIS — D485 Neoplasm of uncertain behavior of skin: Secondary | ICD-10-CM | POA: Diagnosis not present

## 2019-10-12 DIAGNOSIS — L570 Actinic keratosis: Secondary | ICD-10-CM | POA: Diagnosis not present

## 2019-10-12 DIAGNOSIS — Z85820 Personal history of malignant melanoma of skin: Secondary | ICD-10-CM | POA: Diagnosis not present

## 2019-10-12 DIAGNOSIS — Z85828 Personal history of other malignant neoplasm of skin: Secondary | ICD-10-CM | POA: Diagnosis not present

## 2019-10-12 DIAGNOSIS — L821 Other seborrheic keratosis: Secondary | ICD-10-CM | POA: Diagnosis not present

## 2019-10-12 DIAGNOSIS — D2271 Melanocytic nevi of right lower limb, including hip: Secondary | ICD-10-CM | POA: Diagnosis not present

## 2019-10-12 DIAGNOSIS — D2272 Melanocytic nevi of left lower limb, including hip: Secondary | ICD-10-CM | POA: Diagnosis not present

## 2019-10-15 DIAGNOSIS — Z01818 Encounter for other preprocedural examination: Secondary | ICD-10-CM | POA: Diagnosis not present

## 2019-10-19 ENCOUNTER — Ambulatory Visit: Payer: BLUE CROSS/BLUE SHIELD

## 2019-10-22 ENCOUNTER — Telehealth: Payer: Self-pay | Admitting: Obstetrics & Gynecology

## 2019-10-22 NOTE — Telephone Encounter (Signed)
Spoke with patient. Confirmed  Breast MRI has been scheduled for 11/29/2019 at [facility].   Advised patient I will forward this to buisness office for precert. Advised precert typically completed within 30 days of imaging, since authorization is typically only valid for 30 days. Patient verbalizes understanding and is thankful for call.   Patient request return call to notify once precert is completed.   Routing to [employer].

## 2019-10-22 NOTE — Telephone Encounter (Signed)
Patient states MRI was scheduled a month ago. Wants to be sure you have all of the information you need to get that approved. She would like a call back regarding this.

## 2019-10-25 DIAGNOSIS — Z13220 Encounter for screening for lipoid disorders: Secondary | ICD-10-CM | POA: Diagnosis not present

## 2019-10-25 DIAGNOSIS — Z0000 Encounter for general adult medical examination without abnormal findings: Secondary | ICD-10-CM | POA: Diagnosis not present

## 2019-10-26 DIAGNOSIS — Z0000 Encounter for general adult medical examination without abnormal findings: Secondary | ICD-10-CM | POA: Diagnosis not present

## 2019-10-29 DIAGNOSIS — F4322 Adjustment disorder with anxiety: Secondary | ICD-10-CM | POA: Diagnosis not present

## 2019-10-29 DIAGNOSIS — M47812 Spondylosis without myelopathy or radiculopathy, cervical region: Secondary | ICD-10-CM | POA: Diagnosis not present

## 2019-11-01 DIAGNOSIS — F4322 Adjustment disorder with anxiety: Secondary | ICD-10-CM | POA: Diagnosis not present

## 2019-11-05 DIAGNOSIS — M47812 Spondylosis without myelopathy or radiculopathy, cervical region: Secondary | ICD-10-CM | POA: Diagnosis not present

## 2019-11-10 DIAGNOSIS — Z1159 Encounter for screening for other viral diseases: Secondary | ICD-10-CM | POA: Diagnosis not present

## 2019-11-15 DIAGNOSIS — K5730 Diverticulosis of large intestine without perforation or abscess without bleeding: Secondary | ICD-10-CM | POA: Diagnosis not present

## 2019-11-15 DIAGNOSIS — K6289 Other specified diseases of anus and rectum: Secondary | ICD-10-CM | POA: Diagnosis not present

## 2019-11-15 DIAGNOSIS — D123 Benign neoplasm of transverse colon: Secondary | ICD-10-CM | POA: Diagnosis not present

## 2019-11-15 DIAGNOSIS — Z86010 Personal history of colonic polyps: Secondary | ICD-10-CM | POA: Diagnosis not present

## 2019-11-15 DIAGNOSIS — D122 Benign neoplasm of ascending colon: Secondary | ICD-10-CM | POA: Diagnosis not present

## 2019-11-19 DIAGNOSIS — F4322 Adjustment disorder with anxiety: Secondary | ICD-10-CM | POA: Diagnosis not present

## 2019-11-22 DIAGNOSIS — M47812 Spondylosis without myelopathy or radiculopathy, cervical region: Secondary | ICD-10-CM | POA: Diagnosis not present

## 2019-11-29 ENCOUNTER — Ambulatory Visit
Admission: RE | Admit: 2019-11-29 | Discharge: 2019-11-29 | Disposition: A | Discharge status: Home / Self Care | Payer: BC Managed Care – PPO | Source: Ambulatory Visit | Attending: Obstetrics & Gynecology | Admitting: Obstetrics & Gynecology

## 2019-11-29 ENCOUNTER — Other Ambulatory Visit: Payer: Self-pay

## 2019-11-29 ENCOUNTER — Other Ambulatory Visit: Payer: BC Managed Care – PPO

## 2019-11-29 ENCOUNTER — Telehealth: Payer: Self-pay | Admitting: *Deleted

## 2019-11-29 DIAGNOSIS — Z1239 Encounter for other screening for malignant neoplasm of breast: Secondary | ICD-10-CM | POA: Diagnosis not present

## 2019-11-29 DIAGNOSIS — Z9189 Other specified personal risk factors, not elsewhere classified: Secondary | ICD-10-CM

## 2019-11-29 DIAGNOSIS — Z803 Family history of malignant neoplasm of breast: Secondary | ICD-10-CM

## 2019-11-29 MED ORDER — GADOBUTROL 1 MMOL/ML IV SOLN
7.0000 mL | Freq: Once | INTRAVENOUS | Status: AC | PRN
  Administered 2019-11-29: 7 mL via INTRAVENOUS

## 2019-11-29 NOTE — Telephone Encounter (Signed)
Patient is due for next screening MMG 09/2020. Patient is asking if she should proceed with her screening MMG earlier, like June 2021? States she has checked with her insurance plan and she can do this. Wants to be able to have screening q6 months, alternating with her  MRI.  Patient request this be reviewed with Dr. Sabra Heck before she proceeds.   Routing to Dr. Sabra Heck.

## 2019-11-29 NOTE — Telephone Encounter (Signed)
Burnice Logan, RN  11/29/2019 4:00 PM EST    Spoke with patient, advised as seen below per Dr. Sabra Heck. Patient request copy of results be mailed to address on file. Copy of results printed and placed in [place] mail. Patient verbalizes understanding and is agreeable.   See telephone encounter dated 11/29/19 to review additional questions with Dr. Sabra Heck.   Patient removed from IMG hold.

## 2019-11-29 NOTE — Telephone Encounter (Signed)
-----   Message from Megan Salon, MD sent at 11/29/2019  3:12 PM EST ----- Please let pt know her breast MRI is negative for abnormal findings.  Out of MMG hold.

## 2019-11-30 NOTE — Telephone Encounter (Signed)
If her insurance would allow her to have her MMG in June, then yes, I agree with doing it sooner and staying on the every six month screening alternating with MMG and breast MRI.  Thanks.

## 2019-12-01 NOTE — Telephone Encounter (Signed)
Call to patient, left detailed message, ok per dpr. Name identified on voicemail. Advised per Dr. Sabra Heck. Advised patient she may contact [facility] to schedule her next screening MMG at her convenience. Return call to the office if any additional questions.   Encounter closed.

## 2019-12-07 DIAGNOSIS — H52223 Regular astigmatism, bilateral: Secondary | ICD-10-CM | POA: Diagnosis not present

## 2019-12-07 DIAGNOSIS — H532 Diplopia: Secondary | ICD-10-CM | POA: Diagnosis not present

## 2019-12-14 DIAGNOSIS — G8929 Other chronic pain: Secondary | ICD-10-CM | POA: Diagnosis not present

## 2019-12-14 DIAGNOSIS — M542 Cervicalgia: Secondary | ICD-10-CM | POA: Diagnosis not present

## 2019-12-16 ENCOUNTER — Other Ambulatory Visit: Payer: Self-pay | Admitting: Pain Medicine

## 2019-12-16 DIAGNOSIS — M542 Cervicalgia: Secondary | ICD-10-CM

## 2019-12-16 DIAGNOSIS — G8929 Other chronic pain: Secondary | ICD-10-CM

## 2019-12-20 DIAGNOSIS — F4322 Adjustment disorder with anxiety: Secondary | ICD-10-CM | POA: Diagnosis not present

## 2019-12-21 ENCOUNTER — Other Ambulatory Visit: Payer: Self-pay | Admitting: Pain Medicine

## 2019-12-27 ENCOUNTER — Ambulatory Visit
Admission: RE | Admit: 2019-12-27 | Discharge: 2019-12-27 | Disposition: A | Discharge status: Home / Self Care | Payer: BC Managed Care – PPO | Source: Ambulatory Visit | Attending: Pain Medicine | Admitting: Pain Medicine

## 2019-12-27 ENCOUNTER — Other Ambulatory Visit: Payer: Self-pay

## 2019-12-27 DIAGNOSIS — M4802 Spinal stenosis, cervical region: Secondary | ICD-10-CM | POA: Diagnosis not present

## 2019-12-27 DIAGNOSIS — G8929 Other chronic pain: Secondary | ICD-10-CM

## 2019-12-27 DIAGNOSIS — M542 Cervicalgia: Secondary | ICD-10-CM

## 2019-12-31 DIAGNOSIS — M5020 Other cervical disc displacement, unspecified cervical region: Secondary | ICD-10-CM | POA: Diagnosis not present

## 2019-12-31 DIAGNOSIS — M4312 Spondylolisthesis, cervical region: Secondary | ICD-10-CM | POA: Diagnosis not present

## 2019-12-31 DIAGNOSIS — M47812 Spondylosis without myelopathy or radiculopathy, cervical region: Secondary | ICD-10-CM | POA: Diagnosis not present

## 2020-01-03 DIAGNOSIS — F4322 Adjustment disorder with anxiety: Secondary | ICD-10-CM | POA: Diagnosis not present

## 2020-01-06 DIAGNOSIS — M5030 Other cervical disc degeneration, unspecified cervical region: Secondary | ICD-10-CM | POA: Diagnosis not present

## 2020-01-06 DIAGNOSIS — M4802 Spinal stenosis, cervical region: Secondary | ICD-10-CM | POA: Diagnosis not present

## 2020-01-06 DIAGNOSIS — M47812 Spondylosis without myelopathy or radiculopathy, cervical region: Secondary | ICD-10-CM | POA: Diagnosis not present

## 2020-01-06 DIAGNOSIS — M542 Cervicalgia: Secondary | ICD-10-CM | POA: Diagnosis not present

## 2020-01-11 DIAGNOSIS — F4322 Adjustment disorder with anxiety: Secondary | ICD-10-CM | POA: Diagnosis not present

## 2020-01-14 DIAGNOSIS — F4322 Adjustment disorder with anxiety: Secondary | ICD-10-CM | POA: Diagnosis not present

## 2020-01-18 DIAGNOSIS — F4322 Adjustment disorder with anxiety: Secondary | ICD-10-CM | POA: Diagnosis not present

## 2020-01-24 DIAGNOSIS — F4322 Adjustment disorder with anxiety: Secondary | ICD-10-CM | POA: Diagnosis not present

## 2020-02-04 DIAGNOSIS — F4322 Adjustment disorder with anxiety: Secondary | ICD-10-CM | POA: Diagnosis not present

## 2020-02-07 DIAGNOSIS — F4322 Adjustment disorder with anxiety: Secondary | ICD-10-CM | POA: Diagnosis not present

## 2020-02-12 ENCOUNTER — Ambulatory Visit: Payer: BC Managed Care – PPO | Attending: Internal Medicine

## 2020-02-12 DIAGNOSIS — Z23 Encounter for immunization: Secondary | ICD-10-CM | POA: Insufficient documentation

## 2020-02-12 NOTE — Progress Notes (Signed)
   Covid-19 Vaccination Clinic  Name:  Suzanne Hicks    MRN: [id number] DOB: February 01, 1961  02/12/2020  [name] was observed post Covid-19 immunization for 15 minutes without incidence. She was provided with Vaccine Information Sheet and instruction to access the V-Safe system.   [name] was instructed to call 911 with any severe reactions post vaccine: Marland Kitchen Difficulty breathing  . Swelling of your face and throat  . A fast heartbeat  . A bad rash all over your body  . Dizziness and weakness    Immunizations Administered    Name Date Dose VIS Date Route   Pfizer COVID-19 Vaccine 02/12/2020  2:59 PM 0.3 mL 11/26/2019 Intramuscular   Manufacturer: [place]   Lot: [id number]   [place]: [id number]

## 2020-02-14 DIAGNOSIS — F4322 Adjustment disorder with anxiety: Secondary | ICD-10-CM | POA: Diagnosis not present

## 2020-02-15 DIAGNOSIS — H903 Sensorineural hearing loss, bilateral: Secondary | ICD-10-CM | POA: Diagnosis not present

## 2020-02-21 DIAGNOSIS — F4322 Adjustment disorder with anxiety: Secondary | ICD-10-CM | POA: Diagnosis not present

## 2020-02-28 DIAGNOSIS — F4322 Adjustment disorder with anxiety: Secondary | ICD-10-CM | POA: Diagnosis not present

## 2020-03-03 DIAGNOSIS — F4322 Adjustment disorder with anxiety: Secondary | ICD-10-CM | POA: Diagnosis not present

## 2020-03-04 ENCOUNTER — Ambulatory Visit: Payer: BC Managed Care – PPO | Attending: Internal Medicine

## 2020-03-04 DIAGNOSIS — Z23 Encounter for immunization: Secondary | ICD-10-CM

## 2020-03-04 NOTE — Progress Notes (Signed)
   Covid-19 Vaccination Clinic  Name:  Suzanne Hicks    MRN: [id number] DOB: 20-Feb-1961  03/04/2020  Ms. Suzanne Hicks was observed post Covid-19 immunization for 15 minutes without incident. She was provided with Vaccine Information Sheet and instruction to access the V-Safe system.   Ms. Suzanne Hicks was instructed to call 911 with any severe reactions post vaccine: Marland Kitchen Difficulty breathing  . Swelling of face and throat  . A fast heartbeat  . A bad rash all over body  . Dizziness and weakness   Immunizations Administered    Name Date Dose VIS Date Route   Pfizer COVID-19 Vaccine 03/04/2020  2:56 PM 0.3 mL 11/26/2019 Intramuscular   Manufacturer: [place]   Lot: [id number]   [place]: [id number]

## 2020-03-10 DIAGNOSIS — F4322 Adjustment disorder with anxiety: Secondary | ICD-10-CM | POA: Diagnosis not present

## 2020-03-13 DIAGNOSIS — F4322 Adjustment disorder with anxiety: Secondary | ICD-10-CM | POA: Diagnosis not present

## 2020-03-28 DIAGNOSIS — Z85820 Personal history of malignant melanoma of skin: Secondary | ICD-10-CM | POA: Diagnosis not present

## 2020-03-28 DIAGNOSIS — L821 Other seborrheic keratosis: Secondary | ICD-10-CM | POA: Diagnosis not present

## 2020-03-28 DIAGNOSIS — D2261 Melanocytic nevi of right upper limb, including shoulder: Secondary | ICD-10-CM | POA: Diagnosis not present

## 2020-03-28 DIAGNOSIS — Z85828 Personal history of other malignant neoplasm of skin: Secondary | ICD-10-CM | POA: Diagnosis not present

## 2020-04-03 DIAGNOSIS — F4322 Adjustment disorder with anxiety: Secondary | ICD-10-CM | POA: Diagnosis not present

## 2020-04-10 DIAGNOSIS — F4322 Adjustment disorder with anxiety: Secondary | ICD-10-CM | POA: Diagnosis not present

## 2020-04-21 DIAGNOSIS — F4322 Adjustment disorder with anxiety: Secondary | ICD-10-CM | POA: Diagnosis not present

## 2020-05-08 DIAGNOSIS — F4322 Adjustment disorder with anxiety: Secondary | ICD-10-CM | POA: Diagnosis not present

## 2020-05-09 DIAGNOSIS — Z85828 Personal history of other malignant neoplasm of skin: Secondary | ICD-10-CM | POA: Diagnosis not present

## 2020-05-09 DIAGNOSIS — L821 Other seborrheic keratosis: Secondary | ICD-10-CM | POA: Diagnosis not present

## 2020-05-09 DIAGNOSIS — Z85820 Personal history of malignant melanoma of skin: Secondary | ICD-10-CM | POA: Diagnosis not present

## 2020-05-09 DIAGNOSIS — D1801 Hemangioma of skin and subcutaneous tissue: Secondary | ICD-10-CM | POA: Diagnosis not present

## 2020-05-16 DIAGNOSIS — F4322 Adjustment disorder with anxiety: Secondary | ICD-10-CM | POA: Diagnosis not present

## 2020-05-22 DIAGNOSIS — F4322 Adjustment disorder with anxiety: Secondary | ICD-10-CM | POA: Diagnosis not present

## 2020-06-09 DIAGNOSIS — F4322 Adjustment disorder with anxiety: Secondary | ICD-10-CM | POA: Diagnosis not present

## 2020-06-30 ENCOUNTER — Other Ambulatory Visit: Payer: Self-pay | Admitting: Obstetrics & Gynecology

## 2020-06-30 DIAGNOSIS — Z1231 Encounter for screening mammogram for malignant neoplasm of breast: Secondary | ICD-10-CM

## 2020-07-14 ENCOUNTER — Ambulatory Visit: Payer: BC Managed Care – PPO

## 2020-07-14 ENCOUNTER — Other Ambulatory Visit: Payer: Self-pay

## 2020-07-14 ENCOUNTER — Ambulatory Visit
Admission: RE | Admit: 2020-07-14 | Discharge: 2020-07-14 | Disposition: A | Discharge status: Home / Self Care | Payer: BC Managed Care – PPO | Source: Ambulatory Visit | Attending: Obstetrics & Gynecology | Admitting: Obstetrics & Gynecology

## 2020-07-14 DIAGNOSIS — Z1231 Encounter for screening mammogram for malignant neoplasm of breast: Secondary | ICD-10-CM | POA: Diagnosis not present

## 2020-07-14 DIAGNOSIS — F4322 Adjustment disorder with anxiety: Secondary | ICD-10-CM | POA: Diagnosis not present

## 2020-07-28 DIAGNOSIS — F4322 Adjustment disorder with anxiety: Secondary | ICD-10-CM | POA: Diagnosis not present

## 2020-08-03 ENCOUNTER — Telehealth: Payer: Self-pay

## 2020-08-03 NOTE — Telephone Encounter (Signed)
Patient is calling in regards to speaking with Dr. Sabra Heck.

## 2020-08-11 NOTE — Telephone Encounter (Signed)
Patient calling to speak with Dr Sabra Heck. No other information given.

## 2020-08-11 NOTE — Telephone Encounter (Signed)
Patient is calling to check on receiving a call from [facility].

## 2020-08-15 DIAGNOSIS — F4322 Adjustment disorder with anxiety: Secondary | ICD-10-CM | POA: Diagnosis not present

## 2020-08-18 NOTE — Telephone Encounter (Signed)
Called pt personally and answered questions about her MRI due this fall.  Can you close this encounter?  Thanks.

## 2020-08-24 NOTE — Progress Notes (Signed)
59 y.o. [id number] Married White or Caucasian female here for annual exam.  Doing well.  Denies vaginal bleeding.    Having a lot more neck arthritis.  Surgery was scheduled.  She went for a second opinion and was advised otherwise.  She is seeing a pain specialist.  She does have some increased headaches because of this.  She feels like she has exhausted typical pain management options.  Has considered CBD.  Doesn't really want to take anti-inflammatories.  Patient would like a printed copy of Imitrex.    Would like to have breast MRI in November.  Has increased lifetime risk for breast cancer.  Denies vaginal bleeding.    Patient's last menstrual period was 10/31/2013.          Sexually active: Yes.    The current method of family planning is status post hysterectomy.    Exercising: Yes.    swimming Smoker:  no  Health Maintenance: Pap:  11-11-16 neg History of abnormal Pap:  yes MMG:  07-14-2020 category c density birads 1:neg Colonoscopy:  2020 per pt.  Follow up again due 38yrs BMD:   10-30-18 osteopenia TDaP:  2011 Pneumonia vaccine(s):  n/a Shingrix:   Per patient done a couple of years ago Hep C testing: unsure Screening Labs: PCP   reports that she has never smoked. She has never used smokeless tobacco. She reports current alcohol use. She reports that she does not use drugs.  Past Medical History:  Diagnosis Date  . Anemia   . Anxiety    xanax  . Bicornate uterus   . Family history of breast cancer   . Generalized headaches    imitrex/tylenol   . Headache   . Hearing loss    bilateral - has hearing aids  . History of melanoma   . Insomnia   . Melanoma ([place])    back of left leg  . Skin cancer    Melanoma at 48    Past Surgical History:  Procedure Laterality Date  . CESAREAN SECTION  1993  . CESAREAN SECTION  1998  . CYSTOSCOPY  07/23/2013   Procedure: CYSTOSCOPY;  Surgeon: Reece Packer, MD;  Location: [facility];  Service: Urology;;  . Brigitte Pulse AND CURETTAGE  OF UTERUS     MAB x 3  . LAPAROSCOPIC TOTAL HYSTERECTOMY  12/14   at [facility]  . LAPAROSCOPY  07/23/2013   Procedure: LAPAROSCOPY DIAGNOSTIC;  Surgeon: Lyman Speller, MD;  Location: [facility];  Service: Gynecology;;  . RADIOFREQUENCY ABLATION     Neck  . SKIN BIOPSY      Current Outpatient Medications  Medication Sig Dispense Refill  . aspirin-acetaminophen-caffeine (EXCEDRIN MIGRAINE) 250-250-65 MG per tablet Take 2 tablets by mouth as needed.     . Calcium-Phosphorus-Vitamin D (CITRACAL +D3 PO) Take 1 each by mouth daily.    . meloxicam (MOBIC) 15 MG tablet Take 15 mg by mouth daily as needed for pain.    . Multiple Vitamins-Minerals (ONE-A-DAY WOMENS PO) Take by mouth.    . SUMAtriptan (IMITREX) 100 MG tablet TAKE ONE TABLET AT ONSET OF HEADACHE. MAY REPEAT IN TWO HOURS IF NEEDED. 27 tablet 4  . ibuprofen (ADVIL,MOTRIN) 200 MG tablet Take 600 mg by mouth as needed.  (Patient not taking: Reported on 08/28/2020)    . NONFORMULARY OR COMPOUNDED ITEM Vit E 200u/ml suppository.  Place vaginally three times weekly. (Patient not taking: Reported on 07/27/2019) 36 each 4   No current facility-administered medications for this  visit.    Family History  Problem Relation Age of Onset  . Breast cancer Mother 76       Recurrence  . Other Mother        memory issue, had one seizure  . Osteoporosis Mother   . Alzheimer's disease Mother   . Liver cancer Mother   . Cancer Father        Prostate  . High blood pressure Father   . Other Father        neuropathy  . GER disease Father        "vocal cord numbing"  . Cancer Paternal Grandmother        Basil cell-skin  . Stroke Paternal Grandfather   . Breast cancer Other 57       maternal cousin-bilateral mastectomy-fast growing  . Bladder Cancer Maternal Grandmother        d. 76s  . Lymphoma Paternal Aunt     Review of Systems  All other systems reviewed and are negative.   Exam:   BP 102/60 (BP Location: Left Arm, Patient Position:  Sitting, Cuff Size: Normal)   Pulse 68   Resp 14   Ht 5' 8.25" (1.734 m)   Wt 163 lb (73.9 kg)   LMP 10/31/2013   BMI 24.60 kg/m   Height: 5' 8.25" (173.4 cm)  General appearance: alert, cooperative and appears stated age Head: Normocephalic, without obvious abnormality, atraumatic Neck: no adenopathy, supple, symmetrical, trachea midline and thyroid normal to inspection and palpation Lungs: clear to auscultation bilaterally Breasts: normal appearance, no masses or tenderness Heart: regular rate and rhythm Abdomen: soft, non-tender; bowel sounds normal; no masses,  no organomegaly Extremities: extremities normal, atraumatic, no cyanosis or edema Skin: Skin color, texture, turgor normal. No rashes or lesions Lymph nodes: Cervical, supraclavicular, and axillary nodes normal. No abnormal inguinal nodes palpated Neurologic: Grossly normal   Pelvic: External genitalia:  no lesions              Urethra:  normal appearing urethra with no masses, tenderness or lesions              Bartholins and Skenes: normal                 Vagina: normal appearing vagina with normal color and discharge, no lesions              Cervix: absent              Pap taken: No. Bimanual Exam:  Uterus:  uterus absent              Adnexa: no mass, fullness, tenderness               Rectovaginal: Confirms               Anus:  normal sphincter tone, no lesions  Chaperone, Terence Lux, CMA, was present for exam.  A:  Well Woman with normal exam PMP, no HRT H/o TLH/bilateral salpingectomy/cystoscopy with adhesions due to prior cesarean sections and h/o bicornuate uterus Lifetime risk of >20% of breast cancer  P:   Mammogram yearly and MRI yearly.  Desires doing MRI before the end of the year pap smear not indicated Release for colonoscopy signed  Release for lab work, vaccines and if she's had a Hep C discussed. BMD done 2019 Return annually or prn

## 2020-08-28 ENCOUNTER — Other Ambulatory Visit: Payer: Self-pay

## 2020-08-28 ENCOUNTER — Ambulatory Visit (INDEPENDENT_AMBULATORY_CARE_PROVIDER_SITE_OTHER): Payer: BC Managed Care – PPO | Admitting: Obstetrics & Gynecology

## 2020-08-28 ENCOUNTER — Telehealth: Payer: Self-pay | Admitting: *Deleted

## 2020-08-28 ENCOUNTER — Encounter: Payer: Self-pay | Admitting: Obstetrics & Gynecology

## 2020-08-28 VITALS — BP 102/60 | HR 68 | Resp 14 | Ht 68.25 in | Wt 163.0 lb

## 2020-08-28 DIAGNOSIS — Z9189 Other specified personal risk factors, not elsewhere classified: Secondary | ICD-10-CM

## 2020-08-28 DIAGNOSIS — Z01419 Encounter for gynecological examination (general) (routine) without abnormal findings: Secondary | ICD-10-CM | POA: Diagnosis not present

## 2020-08-28 DIAGNOSIS — Z1239 Encounter for other screening for malignant neoplasm of breast: Secondary | ICD-10-CM

## 2020-08-28 DIAGNOSIS — Z803 Family history of malignant neoplasm of breast: Secondary | ICD-10-CM

## 2020-08-28 MED ORDER — SUMATRIPTAN SUCCINATE 100 MG PO TABS: ORAL_TABLET | ORAL | 4 refills | Status: DC

## 2020-08-28 NOTE — Telephone Encounter (Signed)
-----   Message from Megan Salon, MD sent at 08/28/2020  8:59 AM EDT ----- Regarding: MRI Sharee Pimple, This pt would like to have her breast in November.  Can we precert this after Oct 24EC for her?  She has increased lifetime risk of breast cancer >20% and family hx of breast cancer.  She did one last year.  Thanks. Vinnie Level

## 2020-08-29 DIAGNOSIS — F4322 Adjustment disorder with anxiety: Secondary | ICD-10-CM | POA: Diagnosis not present

## 2020-09-01 NOTE — Telephone Encounter (Signed)
Spoke with patient.  Advised order has been placed for Screening breast MRI at [facility].  Patient will check with her insurance regarding coverage prior to scheduling.  Once scheduled our office will be notified by [facility], our office will then precert.  Questions answered. Patient thankful for call.   Routing to provider for final review. Patient is agreeable to disposition. Will close encounter.  Cc: Hayley Carder

## 2020-09-01 NOTE — Telephone Encounter (Signed)
Patient returned call

## 2020-09-01 NOTE — Telephone Encounter (Signed)
Left message to call Sharee Pimple, RN at [place].    Order placed for screening breast MRI at [facility].

## 2020-09-01 NOTE — Telephone Encounter (Signed)
Megan Salon, MD  Burnice Logan, RN Could you please call her let her know this so we will need to try and get the precert done in early December so she can have this as close to 11/29/2020 as possible? Can you put this on your radar?   Thanks.   Vinnie Level       Previous Messages   ----- Message -----  From: Burnice Logan, RN  Sent: 08/28/2020  1:54 PM EDT  To: Megan Salon, MD  Subject: RE: MRI                      Dr. Sabra Heck,   Her last MRI was completed on 11/29/19, her insurance will not likely cover it unless it is a year out. Is she aware of this? I don't mind calling her, just wasn't sure if she was aware.   Sharee Pimple

## 2020-09-04 DIAGNOSIS — F4322 Adjustment disorder with anxiety: Secondary | ICD-10-CM | POA: Diagnosis not present

## 2020-09-12 DIAGNOSIS — F4322 Adjustment disorder with anxiety: Secondary | ICD-10-CM | POA: Diagnosis not present

## 2020-09-15 DIAGNOSIS — Z23 Encounter for immunization: Secondary | ICD-10-CM | POA: Diagnosis not present

## 2020-09-19 DIAGNOSIS — F4322 Adjustment disorder with anxiety: Secondary | ICD-10-CM | POA: Diagnosis not present

## 2020-09-26 ENCOUNTER — Telehealth: Payer: Self-pay

## 2020-09-26 NOTE — Telephone Encounter (Signed)
Patient called asking to speak with Sharee Pimple or [facility] regarding MRI.

## 2020-09-27 NOTE — Telephone Encounter (Signed)
Spoke with patient. Advised as seen below. Advised patient per previous telephone conversation that screening breast MRI may not be covered unless at least one year from the last screening breast MRI. Patient states she would like to check with her insurance plan before rescheduling MRI, she will return call to office to provide update. Patient thankful for call.

## 2020-09-27 NOTE — Telephone Encounter (Signed)
Spoke with Suzanne Hicks with [facility] regarding initiating a PA for Bilateral Breast MRI. PA denied approval due to Breast MRI being scheduled less than a year from previous Breast MRI. Last Breast MRI was on 11/29/2019. PA withdrawn, will call back and initiate new PA once appointment is rescheduled.  Routing to Glorianne Manchester, Therapist, sports.

## 2020-09-28 NOTE — Telephone Encounter (Signed)
Patient is returning call to Suzanne Hicks to discuss MRI pre-cert.

## 2020-09-28 NOTE — Telephone Encounter (Signed)
Spoke with patient. Patient states she has contacted her insurance company, spoke with Sadie L. Was advised no PA in process for breast MRI. Advised patient that the PA was withdrawn, not at least 1 year from the last breast MRI. Patient states Sadie L from [employer] will be contacting office to further discuss PA. Patient request to keep MRI as scheduled for now until she has clarification. Patient is aware business office will f/u.   Routing to [employer]

## 2020-09-28 NOTE — Telephone Encounter (Signed)
Suzanne Hicks from [facility] calling in regards to breast MRI. States a PA is needed to move forward with MRI.  Routing to Triage.

## 2020-09-29 NOTE — Telephone Encounter (Signed)
Spoke with [place] at [employer].  Questions answered regarding Breast MRI.  Charlett Blake is going to reach out to the patient to explain the PA process. Advised per [facility] specialty, PA would be denied beacuse Screening breast MRI not scheduled at least one year from the last screening breast MRI, would not be approved, PA was then withdrawn. Recommended patient schedule after 11/28/20, could then resubmit PA.

## 2020-10-03 NOTE — Telephone Encounter (Signed)
Per review of Epic, patient has r/s screening breast MRI to 11/29/20.   Routing to [employer] for [employer].   Encounter closed.

## 2020-10-05 DIAGNOSIS — F4322 Adjustment disorder with anxiety: Secondary | ICD-10-CM | POA: Diagnosis not present

## 2020-10-10 DIAGNOSIS — Z85828 Personal history of other malignant neoplasm of skin: Secondary | ICD-10-CM | POA: Diagnosis not present

## 2020-10-10 DIAGNOSIS — F4322 Adjustment disorder with anxiety: Secondary | ICD-10-CM | POA: Diagnosis not present

## 2020-10-10 DIAGNOSIS — D2262 Melanocytic nevi of left upper limb, including shoulder: Secondary | ICD-10-CM | POA: Diagnosis not present

## 2020-10-10 DIAGNOSIS — D2261 Melanocytic nevi of right upper limb, including shoulder: Secondary | ICD-10-CM | POA: Diagnosis not present

## 2020-10-10 DIAGNOSIS — Z85820 Personal history of malignant melanoma of skin: Secondary | ICD-10-CM | POA: Diagnosis not present

## 2020-10-17 DIAGNOSIS — F4322 Adjustment disorder with anxiety: Secondary | ICD-10-CM | POA: Diagnosis not present

## 2020-10-22 ENCOUNTER — Other Ambulatory Visit: Payer: BC Managed Care – PPO

## 2020-10-24 DIAGNOSIS — M8588 Other specified disorders of bone density and structure, other site: Secondary | ICD-10-CM | POA: Diagnosis not present

## 2020-10-24 DIAGNOSIS — Z13220 Encounter for screening for lipoid disorders: Secondary | ICD-10-CM | POA: Diagnosis not present

## 2020-10-24 DIAGNOSIS — Z0000 Encounter for general adult medical examination without abnormal findings: Secondary | ICD-10-CM | POA: Diagnosis not present

## 2020-10-27 ENCOUNTER — Ambulatory Visit: Payer: BC Managed Care – PPO | Admitting: Obstetrics & Gynecology

## 2020-10-31 ENCOUNTER — Telehealth: Payer: Self-pay

## 2020-10-31 DIAGNOSIS — F4322 Adjustment disorder with anxiety: Secondary | ICD-10-CM | POA: Diagnosis not present

## 2020-10-31 NOTE — Telephone Encounter (Signed)
Spoke with pt. Pt calling to get update on approval through insurance for scheduled Breast MRI on 11/29/20.  Pt advised Breast MRI has been approved per Hayley in business office. Pt verbalized understanding.  Encounter closed.

## 2020-10-31 NOTE — Telephone Encounter (Signed)
Patient has a question for nurse about breast MRI scheduled in December. Ask for [facility].

## 2020-11-03 ENCOUNTER — Ambulatory Visit: Payer: BC Managed Care – PPO | Admitting: Obstetrics & Gynecology

## 2020-11-07 DIAGNOSIS — F4322 Adjustment disorder with anxiety: Secondary | ICD-10-CM | POA: Diagnosis not present

## 2020-11-13 ENCOUNTER — Other Ambulatory Visit: Payer: Self-pay | Admitting: Family Medicine

## 2020-11-13 DIAGNOSIS — M8580 Other specified disorders of bone density and structure, unspecified site: Secondary | ICD-10-CM

## 2020-11-14 DIAGNOSIS — F4322 Adjustment disorder with anxiety: Secondary | ICD-10-CM | POA: Diagnosis not present

## 2020-11-17 ENCOUNTER — Other Ambulatory Visit: Payer: Self-pay

## 2020-11-17 ENCOUNTER — Ambulatory Visit
Admission: RE | Admit: 2020-11-17 | Discharge: 2020-11-17 | Disposition: A | Discharge status: Home / Self Care | Payer: BC Managed Care – PPO | Source: Ambulatory Visit | Attending: Family Medicine | Admitting: Family Medicine

## 2020-11-17 DIAGNOSIS — M8580 Other specified disorders of bone density and structure, unspecified site: Secondary | ICD-10-CM

## 2020-11-17 DIAGNOSIS — Z780 Asymptomatic menopausal state: Secondary | ICD-10-CM | POA: Diagnosis not present

## 2020-11-17 DIAGNOSIS — M85852 Other specified disorders of bone density and structure, left thigh: Secondary | ICD-10-CM | POA: Diagnosis not present

## 2020-11-29 ENCOUNTER — Ambulatory Visit
Admission: RE | Admit: 2020-11-29 | Discharge: 2020-11-29 | Disposition: A | Discharge status: Home / Self Care | Payer: BC Managed Care – PPO | Source: Ambulatory Visit | Attending: Obstetrics & Gynecology | Admitting: Obstetrics & Gynecology

## 2020-11-29 ENCOUNTER — Other Ambulatory Visit: Payer: Self-pay

## 2020-11-29 DIAGNOSIS — Z803 Family history of malignant neoplasm of breast: Secondary | ICD-10-CM

## 2020-11-29 DIAGNOSIS — N6489 Other specified disorders of breast: Secondary | ICD-10-CM | POA: Diagnosis not present

## 2020-11-29 DIAGNOSIS — Z9189 Other specified personal risk factors, not elsewhere classified: Secondary | ICD-10-CM

## 2020-11-29 DIAGNOSIS — Z1239 Encounter for other screening for malignant neoplasm of breast: Secondary | ICD-10-CM

## 2020-11-29 MED ORDER — GADOBUTROL 1 MMOL/ML IV SOLN
5.0000 mL | Freq: Once | INTRAVENOUS | Status: AC | PRN
  Administered 2020-11-29: 5 mL via INTRAVENOUS

## 2021-02-05 DIAGNOSIS — Z1159 Encounter for screening for other viral diseases: Secondary | ICD-10-CM | POA: Diagnosis not present

## 2021-03-13 IMAGING — MG DIGITAL SCREENING BILAT W/ TOMO W/ CAD
8 series · 9 of 24 positions shown · non-contrast
Comparison: Previous exam(s).

CLINICAL DATA: Screening.

EXAM:
DIGITAL SCREENING BILATERAL MAMMOGRAM WITH TOMO AND CAD

[L MLO synth-2D]
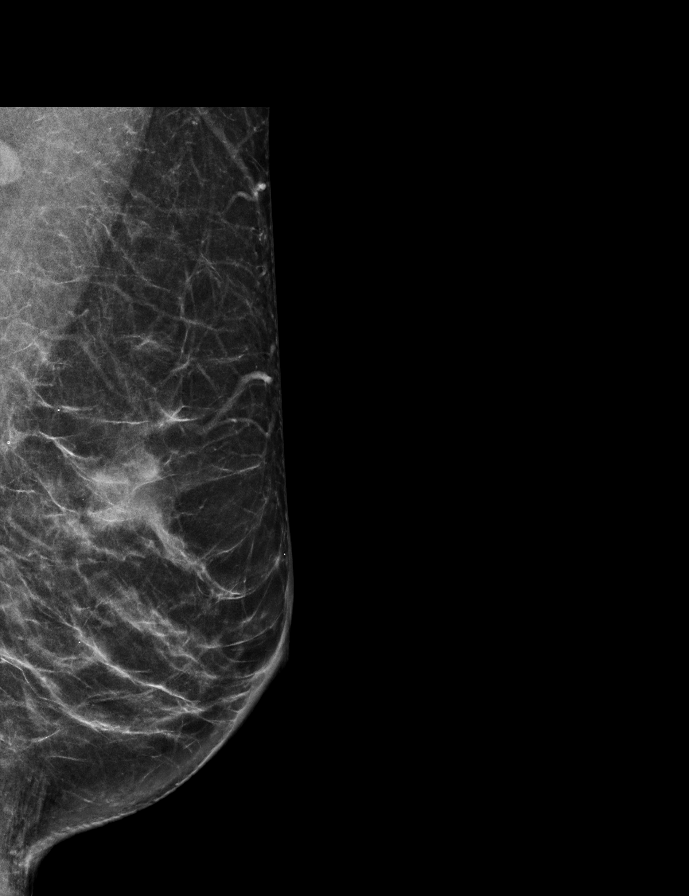

[R MLO synth-2D]
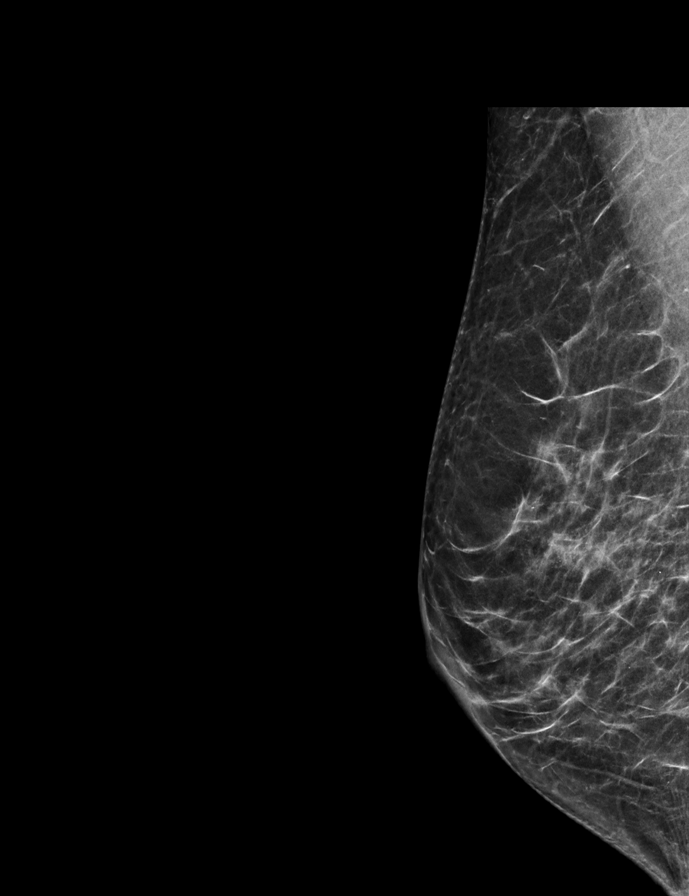

[R CC synth-2D]
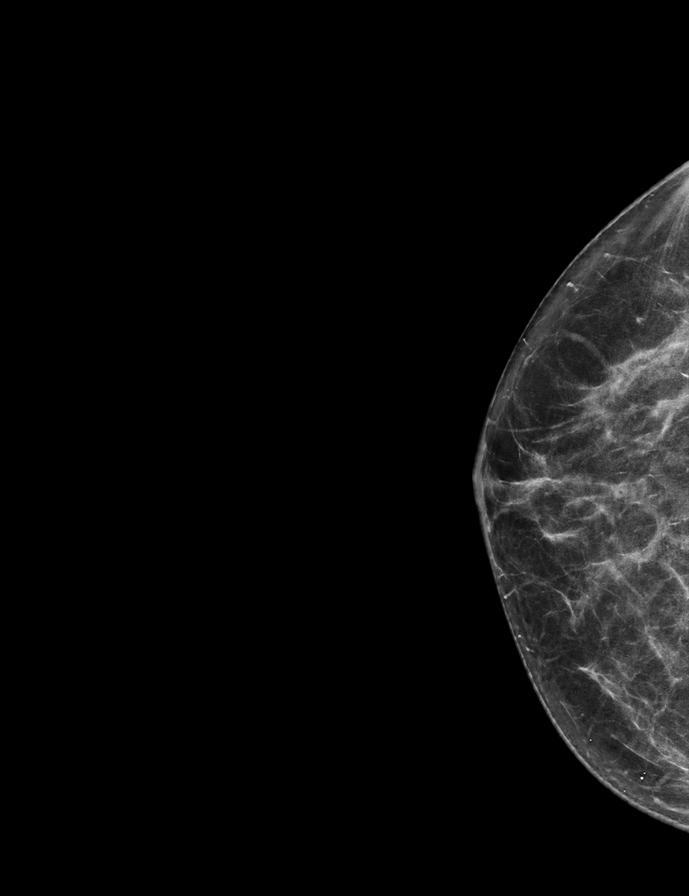

[L CC synth-2D]
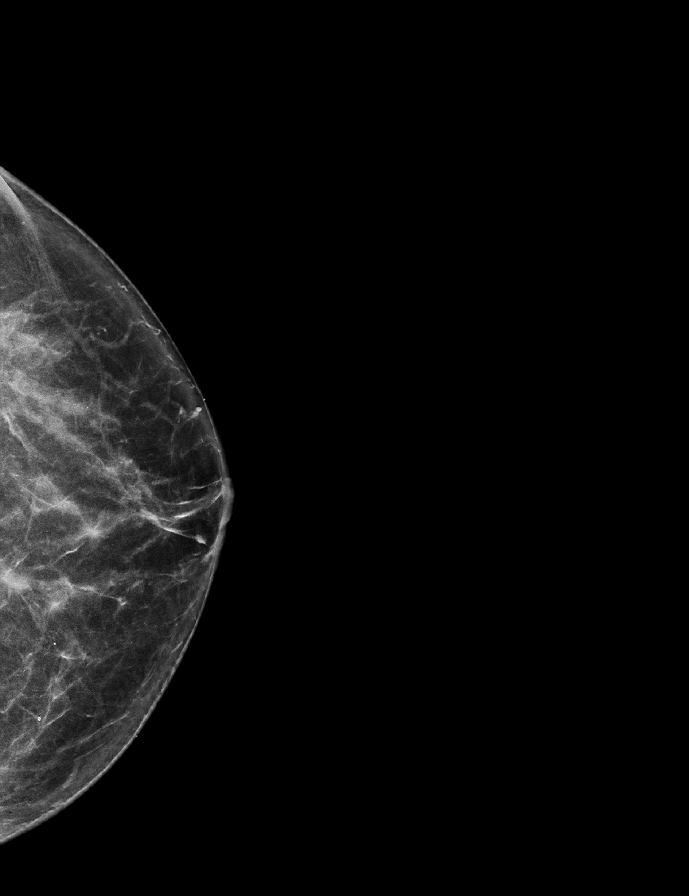

[R CC tomo · 2 of 62 frames shown]
[frame 21/62]
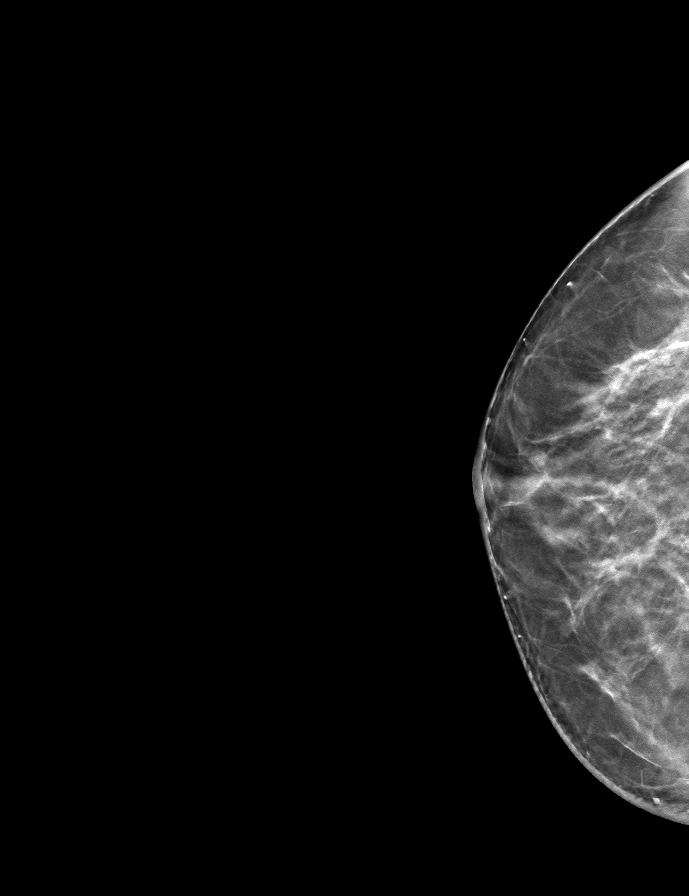
[frame 31/62]
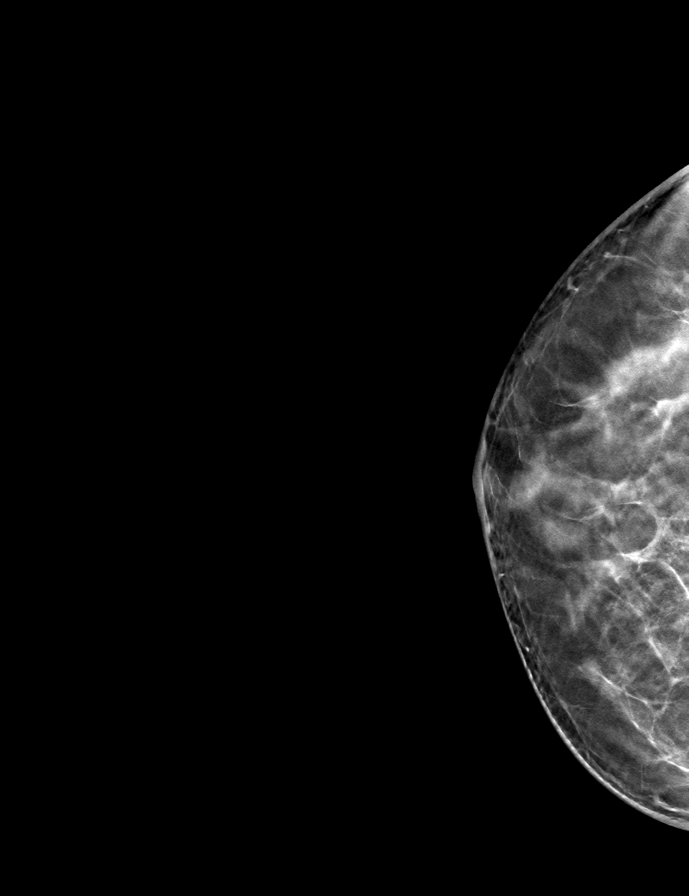

[L MLO tomo · tomo slice 31/61.0]
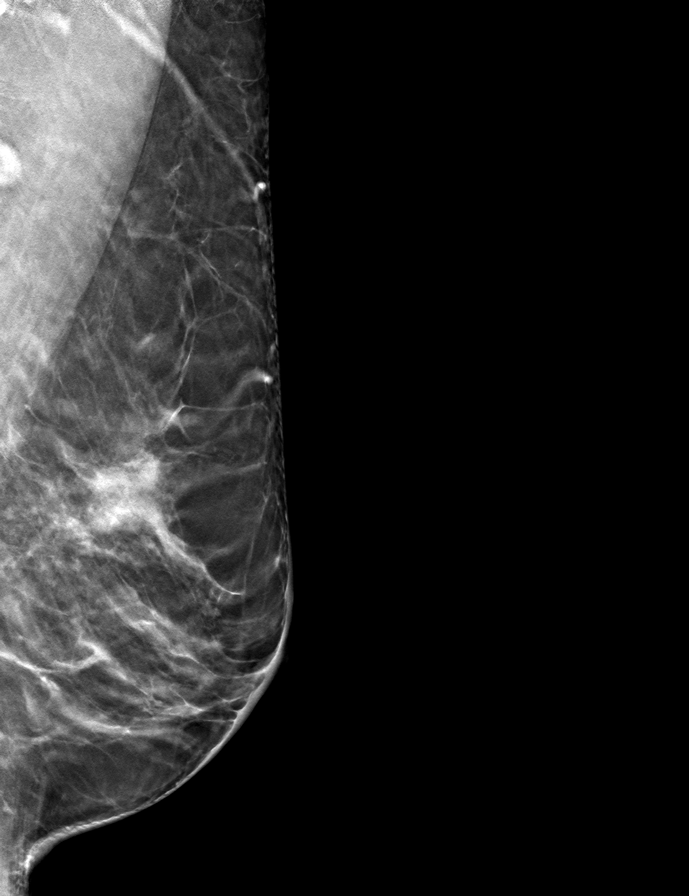

[R MLO tomo · tomo slice 31/60.0]
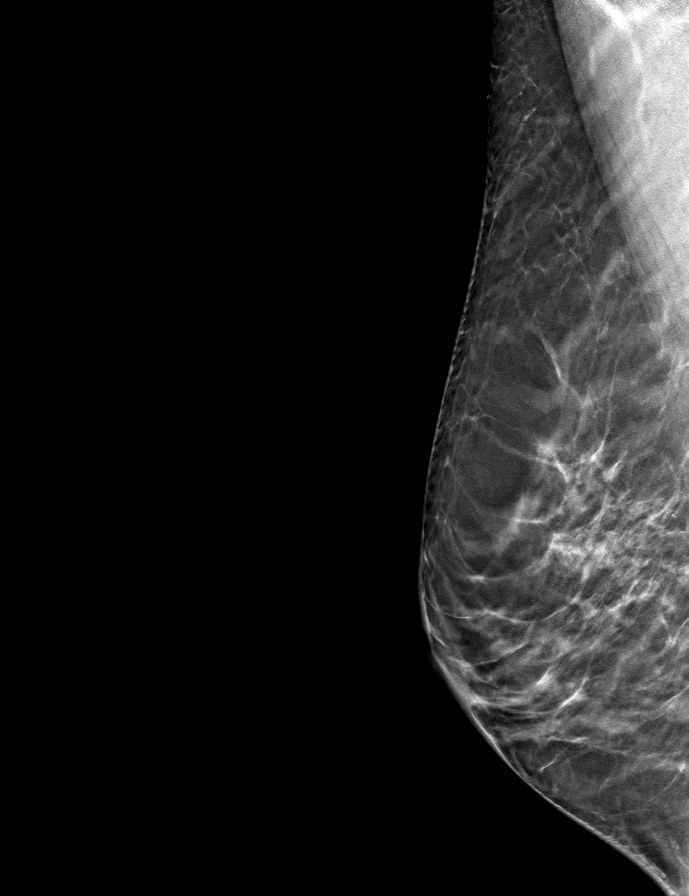

[L CC tomo · tomo slice 33/65.0]
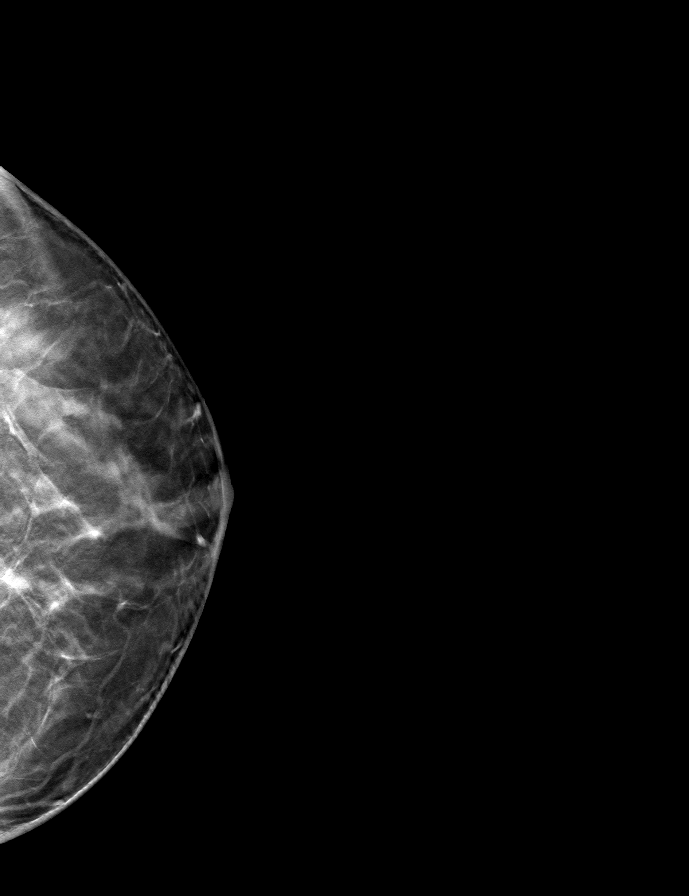

[9 of 24 positions shown; findings below may reference images not displayed]

ACR Breast Density Category c: The breast tissue is heterogeneously
dense, which may obscure small masses.
FINDINGS: There are no findings suspicious for malignancy. Images were
processed with CAD.
IMPRESSION: No mammographic evidence of malignancy. A result letter of this
screening mammogram will be mailed directly to the patient.

RECOMMENDATION:
Screening mammogram in one year. (Code:[id number]-[id number])

BI-RADS CATEGORY  1: Negative.

## 2021-04-10 DIAGNOSIS — D2271 Melanocytic nevi of right lower limb, including hip: Secondary | ICD-10-CM | POA: Diagnosis not present

## 2021-04-10 DIAGNOSIS — D485 Neoplasm of uncertain behavior of skin: Secondary | ICD-10-CM | POA: Diagnosis not present

## 2021-04-10 DIAGNOSIS — L821 Other seborrheic keratosis: Secondary | ICD-10-CM | POA: Diagnosis not present

## 2021-04-10 DIAGNOSIS — Z85820 Personal history of malignant melanoma of skin: Secondary | ICD-10-CM | POA: Diagnosis not present

## 2021-04-10 DIAGNOSIS — Z85828 Personal history of other malignant neoplasm of skin: Secondary | ICD-10-CM | POA: Diagnosis not present

## 2021-04-10 DIAGNOSIS — D2239 Melanocytic nevi of other parts of face: Secondary | ICD-10-CM | POA: Diagnosis not present

## 2021-06-04 DIAGNOSIS — Z1152 Encounter for screening for COVID-19: Secondary | ICD-10-CM | POA: Diagnosis not present

## 2021-06-12 DIAGNOSIS — Z1159 Encounter for screening for other viral diseases: Secondary | ICD-10-CM | POA: Diagnosis not present

## 2021-06-19 DIAGNOSIS — Z1152 Encounter for screening for COVID-19: Secondary | ICD-10-CM | POA: Diagnosis not present

## 2021-07-02 ENCOUNTER — Other Ambulatory Visit: Payer: Self-pay | Admitting: Obstetrics & Gynecology

## 2021-07-02 DIAGNOSIS — Z1231 Encounter for screening mammogram for malignant neoplasm of breast: Secondary | ICD-10-CM

## 2021-07-04 DIAGNOSIS — Z1152 Encounter for screening for COVID-19: Secondary | ICD-10-CM | POA: Diagnosis not present

## 2021-07-05 ENCOUNTER — Other Ambulatory Visit: Payer: Self-pay

## 2021-07-05 ENCOUNTER — Ambulatory Visit
Admission: RE | Admit: 2021-07-05 | Discharge: 2021-07-05 | Disposition: A | Discharge status: Home / Self Care | Payer: BC Managed Care – PPO | Source: Ambulatory Visit | Attending: Obstetrics & Gynecology | Admitting: Obstetrics & Gynecology

## 2021-07-05 DIAGNOSIS — Z1231 Encounter for screening mammogram for malignant neoplasm of breast: Secondary | ICD-10-CM

## 2021-09-06 ENCOUNTER — Ambulatory Visit (INDEPENDENT_AMBULATORY_CARE_PROVIDER_SITE_OTHER): Payer: BC Managed Care – PPO | Admitting: Obstetrics & Gynecology

## 2021-09-06 ENCOUNTER — Encounter (HOSPITAL_BASED_OUTPATIENT_CLINIC_OR_DEPARTMENT_OTHER): Payer: Self-pay | Admitting: Obstetrics & Gynecology

## 2021-09-06 ENCOUNTER — Other Ambulatory Visit: Payer: Self-pay

## 2021-09-06 VITALS — BP 98/71 | HR 67 | Ht 68.5 in | Wt 169.2 lb

## 2021-09-06 DIAGNOSIS — Z803 Family history of malignant neoplasm of breast: Secondary | ICD-10-CM | POA: Diagnosis not present

## 2021-09-06 DIAGNOSIS — G8929 Other chronic pain: Secondary | ICD-10-CM

## 2021-09-06 DIAGNOSIS — Z90710 Acquired absence of both cervix and uterus: Secondary | ICD-10-CM | POA: Diagnosis not present

## 2021-09-06 DIAGNOSIS — R519 Headache, unspecified: Secondary | ICD-10-CM

## 2021-09-06 DIAGNOSIS — Z01419 Encounter for gynecological examination (general) (routine) without abnormal findings: Secondary | ICD-10-CM

## 2021-09-06 DIAGNOSIS — N952 Postmenopausal atrophic vaginitis: Secondary | ICD-10-CM

## 2021-09-06 DIAGNOSIS — Z9189 Other specified personal risk factors, not elsewhere classified: Secondary | ICD-10-CM

## 2021-09-06 NOTE — Progress Notes (Signed)
60 y.o. [id number] Married White or Caucasian female here for annual exam.  Reports she is having more headaches.  Takes something for headache about 15 days a month.  Does have a lot of neck arthritis.  Has seen pain specialists and ortho provider.  Thought the ortho provider was rude.  Can't determine how much is actual headache vs neck pain.  Exedrin migraine seems to help the best.  Did try dry needling and chiropractic care.    Denies vaginal bleeding.  Patient's last menstrual period was 10/31/2013.          Sexually active: Yes.    The current method of family planning is status post hysterectomy.    Exercising: No.  Was swimming but has been helping her daughter Smoker:  no  Health Maintenance: Pap:  11/11/2016 Negative History of abnormal Pap:  remote hx MMG:  07/05/2021 Negative, had breast MRI 11/2020 Colonoscopy:  11/15/2019, follow up 5 years BMD:   11/17/2020, -2.1 TDaP:  2017 Shingrix:   completed Hep C testing: does with Dr. Dema Severin Screening Labs: does with Dr. Dema Severin   reports that she has never smoked. She has never used smokeless tobacco. She reports current alcohol use. She reports that she does not use drugs.  Past Medical History:  Diagnosis Date   Anemia    Anxiety    xanax   Bicornate uterus    Family history of breast cancer    Generalized headaches    imitrex/tylenol    Headache    Hearing loss    bilateral - has hearing aids   History of melanoma    Insomnia    Melanoma ([place])    back of left leg   Skin cancer    Melanoma at 41    Past Surgical History:  Procedure Laterality Date   [place]   CYSTOSCOPY  07/23/2013   Procedure: CYSTOSCOPY;  Surgeon: Reece Packer, MD;  Location: [facility];  Service: Urology;;   DILATION AND CURETTAGE OF UTERUS     MAB x 3   LAPAROSCOPIC TOTAL HYSTERECTOMY  12/14   at [place]  07/23/2013   Procedure: LAPAROSCOPY DIAGNOSTIC;  Surgeon: Lyman Speller, MD;   Location: [facility];  Service: Gynecology;;   RADIOFREQUENCY ABLATION     Neck   SKIN BIOPSY      Current Outpatient Medications  Medication Sig Dispense Refill   aspirin-acetaminophen-caffeine (EXCEDRIN MIGRAINE) 250-250-65 MG per tablet Take 2 tablets by mouth as needed.      Calcium-Phosphorus-Vitamin D (CITRACAL +D3 PO) Take 1 each by mouth daily.     meloxicam (MOBIC) 15 MG tablet Take 15 mg by mouth daily as needed for pain.     Multiple Vitamins-Minerals (ONE-A-DAY WOMENS PO) Take by mouth.     SUMAtriptan (IMITREX) 100 MG tablet TAKE ONE TABLET AT ONSET OF HEADACHE. MAY REPEAT IN TWO HOURS IF NEEDED. 27 tablet 4   ibuprofen (ADVIL,MOTRIN) 200 MG tablet Take 600 mg by mouth as needed.  (Patient not taking: No sig reported)     NONFORMULARY OR COMPOUNDED ITEM Vit E 200u/ml suppository.  Place vaginally three times weekly. (Patient not taking: No sig reported) 36 each 4   No current facility-administered medications for this visit.    Family History  Problem Relation Age of Onset   Breast cancer Mother 47       Recurrence   Other Mother  memory issue, had one seizure   Osteoporosis Mother    Alzheimer's disease Mother    Liver cancer Mother    Cancer Father        Prostate   High blood pressure Father    Other Father        neuropathy   GER disease Father        "vocal cord numbing"   Cancer Paternal Grandmother        Dwaine Gale cell-skin   Stroke Paternal Grandfather    Breast cancer Other 42       maternal cousin-bilateral mastectomy-fast growing   Bladder Cancer Maternal Grandmother        d. 69s   Lymphoma Paternal Aunt     Review of Systems  All other systems reviewed and are negative.  Exam:   BP 98/71 (BP Location: Right Arm, Patient Position: Sitting, Cuff Size: Small)   Pulse 67   Ht 5' 8.5" (1.74 m)   Wt 169 lb 3.2 oz (76.7 kg)   LMP 10/31/2013   BMI 25.35 kg/m   Height: 5' 8.5" (174 cm)  General appearance: alert, cooperative and appears stated  age Head: Normocephalic, without obvious abnormality, atraumatic Neck: no adenopathy, supple, symmetrical, trachea midline and thyroid normal to inspection and palpation Lungs: clear to auscultation bilaterally Breasts: normal appearance, no masses or tenderness Heart: regular rate and rhythm Abdomen: soft, non-tender; bowel sounds normal; no masses,  no organomegaly Extremities: extremities normal, atraumatic, no cyanosis or edema Skin: Skin color, texture, turgor normal. No rashes or lesions Lymph nodes: Cervical, supraclavicular, and axillary nodes normal. No abnormal inguinal nodes palpated Neurologic: Grossly normal  Pelvic: External genitalia:  no lesions              Urethra:  normal appearing urethra with no masses, tenderness or lesions              Bartholins and Skenes: normal                 Vagina: normal appearing vagina with normal color and no discharge, no lesions              Cervix: absent              Pap taken: No. Bimanual Exam:  Uterus:  uterus absent              Adnexa: no mass, fullness, tenderness               Rectovaginal: Confirms               Anus:  normal sphincter tone, no lesions  Chaperone, Octaviano Batty, CMA, was present for exam.  Assessment/Plan: 1. Well woman exam with routine gynecological exam - pap smear not indicated - MMG 06/2021 - colonoscopy 2020, follow up 5 years - repeat BMD 1-2 years - lab work done with Dr. Dema Severin - Care Gaps reviewed/updated  2. Family history of breast cancer  3. History of hysterectomy  4. Increased risk of breast cancer - plan breast MRI later this year  5. Chronic nonintractable headache, unspecified headache type - offered referral to neurology  6. Vaginal atrophy - recommended OTC options for treatment including Replens or Revaree.  She has used Vit E in the past but does not like the messiness with it.

## 2021-09-07 DIAGNOSIS — M5030 Other cervical disc degeneration, unspecified cervical region: Secondary | ICD-10-CM | POA: Diagnosis not present

## 2021-09-07 DIAGNOSIS — Z23 Encounter for immunization: Secondary | ICD-10-CM | POA: Diagnosis not present

## 2021-09-07 DIAGNOSIS — R519 Headache, unspecified: Secondary | ICD-10-CM | POA: Diagnosis not present

## 2021-09-12 ENCOUNTER — Other Ambulatory Visit (HOSPITAL_BASED_OUTPATIENT_CLINIC_OR_DEPARTMENT_OTHER): Payer: Self-pay | Admitting: Obstetrics & Gynecology

## 2021-09-12 ENCOUNTER — Telehealth (HOSPITAL_BASED_OUTPATIENT_CLINIC_OR_DEPARTMENT_OTHER): Payer: Self-pay | Admitting: *Deleted

## 2021-09-12 DIAGNOSIS — Z9189 Other specified personal risk factors, not elsewhere classified: Secondary | ICD-10-CM

## 2021-09-12 NOTE — Telephone Encounter (Signed)
Erroneous encounter

## 2021-09-13 ENCOUNTER — Ambulatory Visit (HOSPITAL_BASED_OUTPATIENT_CLINIC_OR_DEPARTMENT_OTHER): Payer: BC Managed Care – PPO | Admitting: Obstetrics & Gynecology

## 2021-09-17 DIAGNOSIS — G43909 Migraine, unspecified, not intractable, without status migrainosus: Secondary | ICD-10-CM | POA: Diagnosis not present

## 2021-09-17 DIAGNOSIS — M542 Cervicalgia: Secondary | ICD-10-CM | POA: Diagnosis not present

## 2021-09-20 DIAGNOSIS — G43909 Migraine, unspecified, not intractable, without status migrainosus: Secondary | ICD-10-CM | POA: Diagnosis not present

## 2021-09-20 DIAGNOSIS — M542 Cervicalgia: Secondary | ICD-10-CM | POA: Diagnosis not present

## 2021-09-26 DIAGNOSIS — M542 Cervicalgia: Secondary | ICD-10-CM | POA: Diagnosis not present

## 2021-09-26 DIAGNOSIS — G43909 Migraine, unspecified, not intractable, without status migrainosus: Secondary | ICD-10-CM | POA: Diagnosis not present

## 2021-10-01 DIAGNOSIS — M542 Cervicalgia: Secondary | ICD-10-CM | POA: Diagnosis not present

## 2021-10-01 DIAGNOSIS — G43909 Migraine, unspecified, not intractable, without status migrainosus: Secondary | ICD-10-CM | POA: Diagnosis not present

## 2021-10-09 DIAGNOSIS — G43909 Migraine, unspecified, not intractable, without status migrainosus: Secondary | ICD-10-CM | POA: Diagnosis not present

## 2021-10-09 DIAGNOSIS — M542 Cervicalgia: Secondary | ICD-10-CM | POA: Diagnosis not present

## 2021-10-12 DIAGNOSIS — G43909 Migraine, unspecified, not intractable, without status migrainosus: Secondary | ICD-10-CM | POA: Diagnosis not present

## 2021-10-12 DIAGNOSIS — M542 Cervicalgia: Secondary | ICD-10-CM | POA: Diagnosis not present

## 2021-10-16 DIAGNOSIS — M542 Cervicalgia: Secondary | ICD-10-CM | POA: Diagnosis not present

## 2021-10-16 DIAGNOSIS — G43909 Migraine, unspecified, not intractable, without status migrainosus: Secondary | ICD-10-CM | POA: Diagnosis not present

## 2021-10-18 DIAGNOSIS — G43909 Migraine, unspecified, not intractable, without status migrainosus: Secondary | ICD-10-CM | POA: Diagnosis not present

## 2021-10-18 DIAGNOSIS — M542 Cervicalgia: Secondary | ICD-10-CM | POA: Diagnosis not present

## 2021-10-19 DIAGNOSIS — L819 Disorder of pigmentation, unspecified: Secondary | ICD-10-CM | POA: Diagnosis not present

## 2021-10-19 DIAGNOSIS — Z85828 Personal history of other malignant neoplasm of skin: Secondary | ICD-10-CM | POA: Diagnosis not present

## 2021-10-19 DIAGNOSIS — Z85820 Personal history of malignant melanoma of skin: Secondary | ICD-10-CM | POA: Diagnosis not present

## 2021-10-19 DIAGNOSIS — L821 Other seborrheic keratosis: Secondary | ICD-10-CM | POA: Diagnosis not present

## 2021-10-22 DIAGNOSIS — M542 Cervicalgia: Secondary | ICD-10-CM | POA: Diagnosis not present

## 2021-10-22 DIAGNOSIS — G43909 Migraine, unspecified, not intractable, without status migrainosus: Secondary | ICD-10-CM | POA: Diagnosis not present

## 2021-10-23 DIAGNOSIS — Z0000 Encounter for general adult medical examination without abnormal findings: Secondary | ICD-10-CM | POA: Diagnosis not present

## 2021-10-23 DIAGNOSIS — M8588 Other specified disorders of bone density and structure, other site: Secondary | ICD-10-CM | POA: Diagnosis not present

## 2021-10-23 DIAGNOSIS — Z13220 Encounter for screening for lipoid disorders: Secondary | ICD-10-CM | POA: Diagnosis not present

## 2021-10-25 DIAGNOSIS — G43909 Migraine, unspecified, not intractable, without status migrainosus: Secondary | ICD-10-CM | POA: Diagnosis not present

## 2021-10-25 DIAGNOSIS — M542 Cervicalgia: Secondary | ICD-10-CM | POA: Diagnosis not present

## 2021-11-01 DIAGNOSIS — M542 Cervicalgia: Secondary | ICD-10-CM | POA: Diagnosis not present

## 2021-11-01 DIAGNOSIS — G43909 Migraine, unspecified, not intractable, without status migrainosus: Secondary | ICD-10-CM | POA: Diagnosis not present

## 2021-11-13 DIAGNOSIS — M542 Cervicalgia: Secondary | ICD-10-CM | POA: Diagnosis not present

## 2021-11-13 DIAGNOSIS — G43909 Migraine, unspecified, not intractable, without status migrainosus: Secondary | ICD-10-CM | POA: Diagnosis not present

## 2021-11-16 DIAGNOSIS — G43909 Migraine, unspecified, not intractable, without status migrainosus: Secondary | ICD-10-CM | POA: Diagnosis not present

## 2021-11-16 DIAGNOSIS — M542 Cervicalgia: Secondary | ICD-10-CM | POA: Diagnosis not present

## 2021-11-19 DIAGNOSIS — M542 Cervicalgia: Secondary | ICD-10-CM | POA: Diagnosis not present

## 2021-11-19 DIAGNOSIS — G43909 Migraine, unspecified, not intractable, without status migrainosus: Secondary | ICD-10-CM | POA: Diagnosis not present

## 2021-11-22 DIAGNOSIS — G43909 Migraine, unspecified, not intractable, without status migrainosus: Secondary | ICD-10-CM | POA: Diagnosis not present

## 2021-11-22 DIAGNOSIS — M542 Cervicalgia: Secondary | ICD-10-CM | POA: Diagnosis not present

## 2021-11-26 DIAGNOSIS — G43909 Migraine, unspecified, not intractable, without status migrainosus: Secondary | ICD-10-CM | POA: Diagnosis not present

## 2021-11-26 DIAGNOSIS — M542 Cervicalgia: Secondary | ICD-10-CM | POA: Diagnosis not present

## 2021-11-29 DIAGNOSIS — G43909 Migraine, unspecified, not intractable, without status migrainosus: Secondary | ICD-10-CM | POA: Diagnosis not present

## 2021-11-29 DIAGNOSIS — M542 Cervicalgia: Secondary | ICD-10-CM | POA: Diagnosis not present

## 2021-11-30 ENCOUNTER — Other Ambulatory Visit: Payer: Self-pay

## 2021-11-30 ENCOUNTER — Ambulatory Visit
Admission: RE | Admit: 2021-11-30 | Discharge: 2021-11-30 | Disposition: A | Discharge status: Home / Self Care | Payer: BC Managed Care – PPO | Source: Ambulatory Visit | Attending: Obstetrics & Gynecology | Admitting: Obstetrics & Gynecology

## 2021-11-30 DIAGNOSIS — Z1239 Encounter for other screening for malignant neoplasm of breast: Secondary | ICD-10-CM | POA: Diagnosis not present

## 2021-11-30 DIAGNOSIS — Z9189 Other specified personal risk factors, not elsewhere classified: Secondary | ICD-10-CM

## 2021-11-30 MED ORDER — GADOBUTROL 1 MMOL/ML IV SOLN
7.0000 mL | Freq: Once | INTRAVENOUS | Status: AC | PRN
  Administered 2021-11-30: 7 mL via INTRAVENOUS

## 2021-12-03 DIAGNOSIS — G43909 Migraine, unspecified, not intractable, without status migrainosus: Secondary | ICD-10-CM | POA: Diagnosis not present

## 2021-12-03 DIAGNOSIS — M542 Cervicalgia: Secondary | ICD-10-CM | POA: Diagnosis not present

## 2021-12-06 DIAGNOSIS — M542 Cervicalgia: Secondary | ICD-10-CM | POA: Diagnosis not present

## 2021-12-06 DIAGNOSIS — G43909 Migraine, unspecified, not intractable, without status migrainosus: Secondary | ICD-10-CM | POA: Diagnosis not present

## 2021-12-11 DIAGNOSIS — M542 Cervicalgia: Secondary | ICD-10-CM | POA: Diagnosis not present

## 2021-12-11 DIAGNOSIS — G43909 Migraine, unspecified, not intractable, without status migrainosus: Secondary | ICD-10-CM | POA: Diagnosis not present

## 2021-12-14 DIAGNOSIS — M542 Cervicalgia: Secondary | ICD-10-CM | POA: Diagnosis not present

## 2021-12-14 DIAGNOSIS — G43909 Migraine, unspecified, not intractable, without status migrainosus: Secondary | ICD-10-CM | POA: Diagnosis not present

## 2022-01-08 DIAGNOSIS — H52203 Unspecified astigmatism, bilateral: Secondary | ICD-10-CM | POA: Diagnosis not present

## 2022-01-08 DIAGNOSIS — H2513 Age-related nuclear cataract, bilateral: Secondary | ICD-10-CM | POA: Diagnosis not present

## 2022-01-08 DIAGNOSIS — H04123 Dry eye syndrome of bilateral lacrimal glands: Secondary | ICD-10-CM | POA: Diagnosis not present

## 2022-04-05 DIAGNOSIS — Z85820 Personal history of malignant melanoma of skin: Secondary | ICD-10-CM | POA: Diagnosis not present

## 2022-04-05 DIAGNOSIS — D1801 Hemangioma of skin and subcutaneous tissue: Secondary | ICD-10-CM | POA: Diagnosis not present

## 2022-04-05 DIAGNOSIS — Z85828 Personal history of other malignant neoplasm of skin: Secondary | ICD-10-CM | POA: Diagnosis not present

## 2022-05-17 ENCOUNTER — Other Ambulatory Visit: Payer: Self-pay | Admitting: Obstetrics & Gynecology

## 2022-05-17 DIAGNOSIS — Z1231 Encounter for screening mammogram for malignant neoplasm of breast: Secondary | ICD-10-CM

## 2022-05-28 ENCOUNTER — Ambulatory Visit
Admission: RE | Admit: 2022-05-28 | Discharge: 2022-05-28 | Disposition: A | Discharge status: Home / Self Care | Payer: BC Managed Care – PPO | Source: Ambulatory Visit | Attending: Obstetrics & Gynecology | Admitting: Obstetrics & Gynecology

## 2022-05-28 DIAGNOSIS — Z1231 Encounter for screening mammogram for malignant neoplasm of breast: Secondary | ICD-10-CM

## 2022-05-29 ENCOUNTER — Other Ambulatory Visit: Payer: Self-pay | Admitting: Obstetrics & Gynecology

## 2022-05-29 DIAGNOSIS — R928 Other abnormal and inconclusive findings on diagnostic imaging of breast: Secondary | ICD-10-CM

## 2022-05-31 ENCOUNTER — Ambulatory Visit
Admission: RE | Admit: 2022-05-31 | Discharge: 2022-05-31 | Disposition: A | Discharge status: Home / Self Care | Payer: BC Managed Care – PPO | Source: Ambulatory Visit | Attending: Obstetrics & Gynecology | Admitting: Obstetrics & Gynecology

## 2022-05-31 ENCOUNTER — Other Ambulatory Visit: Payer: Self-pay | Admitting: Obstetrics & Gynecology

## 2022-05-31 DIAGNOSIS — R928 Other abnormal and inconclusive findings on diagnostic imaging of breast: Secondary | ICD-10-CM | POA: Diagnosis not present

## 2022-05-31 DIAGNOSIS — R921 Mammographic calcification found on diagnostic imaging of breast: Secondary | ICD-10-CM

## 2022-06-03 ENCOUNTER — Telehealth (HOSPITAL_BASED_OUTPATIENT_CLINIC_OR_DEPARTMENT_OTHER): Payer: Self-pay

## 2022-06-03 NOTE — Telephone Encounter (Signed)
Patient has called and would like for you to reach out to her about her breast mammogram. She would like to speak with you. tbw

## 2022-06-04 ENCOUNTER — Telehealth (HOSPITAL_BASED_OUTPATIENT_CLINIC_OR_DEPARTMENT_OTHER): Payer: Self-pay | Admitting: Obstetrics & Gynecology

## 2022-06-04 NOTE — Telephone Encounter (Signed)
Patient called stated that she returning [place] call from last night And would like for her please call her at [phone].

## 2022-06-04 NOTE — Telephone Encounter (Signed)
This patient has called again today. Patient states she missed your call yesterday at 6. She is currently in [place]. She would like for you to give her a call at [phone]. tbw

## 2022-06-14 ENCOUNTER — Ambulatory Visit
Admission: RE | Admit: 2022-06-14 | Discharge: 2022-06-14 | Disposition: A | Discharge status: Home / Self Care | Payer: BC Managed Care – PPO | Source: Ambulatory Visit | Attending: Obstetrics & Gynecology | Admitting: Obstetrics & Gynecology

## 2022-06-14 DIAGNOSIS — R921 Mammographic calcification found on diagnostic imaging of breast: Secondary | ICD-10-CM

## 2022-06-14 DIAGNOSIS — D0511 Intraductal carcinoma in situ of right breast: Secondary | ICD-10-CM | POA: Diagnosis not present

## 2022-06-15 DIAGNOSIS — C50919 Malignant neoplasm of unspecified site of unspecified female breast: Secondary | ICD-10-CM

## 2022-06-15 HISTORY — DX: Malignant neoplasm of unspecified site of unspecified female breast: C50.919

## 2022-06-18 ENCOUNTER — Encounter (HOSPITAL_BASED_OUTPATIENT_CLINIC_OR_DEPARTMENT_OTHER): Payer: Self-pay | Admitting: Obstetrics & Gynecology

## 2022-06-21 ENCOUNTER — Other Ambulatory Visit: Payer: Self-pay | Admitting: General Surgery

## 2022-06-21 DIAGNOSIS — D0512 Intraductal carcinoma in situ of left breast: Secondary | ICD-10-CM

## 2022-06-24 ENCOUNTER — Other Ambulatory Visit: Payer: Self-pay | Admitting: General Surgery

## 2022-06-24 DIAGNOSIS — D0512 Intraductal carcinoma in situ of left breast: Secondary | ICD-10-CM

## 2022-06-25 ENCOUNTER — Other Ambulatory Visit: Payer: Self-pay | Admitting: General Surgery

## 2022-06-25 DIAGNOSIS — D0511 Intraductal carcinoma in situ of right breast: Secondary | ICD-10-CM

## 2022-06-26 ENCOUNTER — Other Ambulatory Visit: Payer: Self-pay

## 2022-06-26 ENCOUNTER — Encounter (HOSPITAL_BASED_OUTPATIENT_CLINIC_OR_DEPARTMENT_OTHER): Payer: Self-pay | Admitting: General Surgery

## 2022-06-28 MED ORDER — ENSURE PRE-SURGERY PO LIQD: 296.0000 mL | Freq: Once | ORAL | Status: DC

## 2022-06-28 NOTE — Progress Notes (Signed)

## 2022-06-28 NOTE — Progress Notes (Signed)
Location of Breast Cancer:  Ductal carcinoma in situ (DCIS) of right breast   Histology per Pathology Report:  (Definitive pathology pending upcoming surgery) 06/14/2022 Breast, right, needle core biopsy, upper outer, x clip - DUCTAL CARCINOMA IN SITU WITH CALCIFICATIONS AND NECROSIS - SEE COMMENT Microscopic Comment The biopsy has abundant hemorrhage and fibrin deposition with areas of necrosis and detached atypical epithelium; therefore, an invasive component cannot be entirely excluded. Based on the biopsy, the ductal carcinoma in situ has apocrine features, intermediate to high nuclear grade and measures 0.2 cm in greatest linear extent.  Receptor Status: ER(90%), PR (5%)  Did patient present with symptoms (if so, please note symptoms) or was this found on screening mammography?: (from Dr. Cristal Generous 06/21/22 note): Patient "has a family history of breast cancer in her mother and her first cousin. She actually has negative genetic testing in 2019 that was done at the cancer center through [facility] that was negative. She had no mass or discharge at all. She has no real prior breast history for herself either. She underwent a mammogram that shows B density breast. In the right upper outer quadrant there is a 1.4 x 0.8 cm area of pleomorphic calcifications"  Past/Anticipated interventions by surgeon, if any:  07/05/2022 --Dr. Rolm Bookbinder  Scheduled for: RIGHT BREAST SEED GUIDED LUMPECTOMY  06/21/2022 --Dr. Rolm Bookbinder (office visit) We discussed the staging and pathophysiology of breast cancer.  We discussed a sentinel lymph node biopsy and I dont think needs that now.  She understands this may be required if invasive cancer is found on excision. We discussed the options for treatment of the breast cancer which included lumpectomy versus a mastectomy.  We also discussed that she will likely need radiation therapy if she undergoes lumpectomy.  I am going to have her see rad onc  prior to surgery as well.  We discussed mastectomy and the postoperative care for that as well.  We discussed that there is no difference in her survival whether she undergoes lumpectomy with radiation therapy or antiestrogen therapy versus a mastectomy.  She understands her further therapy will be based on what her stages at the time of her operation.  Past/Anticipated interventions by medical oncology, if any:  Will be referred after surgery   Lymphedema issues, if any:  Patient denies    Pain issues, if any:  Yes, continues to deal with pain from breast since biopsy. Has baseline neck arthritis   SAFETY ISSUES: Prior radiation? No Pacemaker/ICD? No Possible current pregnancy? No--hysterectomy Is the patient on methotrexate? No  Current Complaints / other details:  Nothing else of note

## 2022-06-30 NOTE — Progress Notes (Addendum)
Radiation Oncology         [phone] ________________________________  Initial Outpatient Consultation  Name: Suzanne Hicks MRN: [id number]  Date: 07/02/2022  DOB: [birth year]  CC:Harlan Stains, MD  Rolm Bookbinder, MD   REFERRING PHYSICIAN: Rolm Bookbinder, MD  DIAGNOSIS: No diagnosis found.  Stage 0 (cTis (DCIS), cN0, cM0) Right Breast UOQ, Intermediate to high grade ductal carcinoma in-situ , ER+ / PR+ / Her2 not assessed  CHIEF COMPLAINT: Here to discuss management of right breast DCIS  HISTORY OF PRESENT ILLNESS::Suzanne Hicks is a 61 y.o. female who presented with a right breast abnormality on the following imaging: bilateral screening mammogram on the date of 05/28/22.  No symptoms, if any, were reported at that time.   Diagnostic right breast mammogram on 05/31/22 revealed a 1.4 cm of suspicious calcifications in the far posterior upper outer right breast. No abnormal right axillary lymph nodes were identified, however this is in the absence of targeted ultrasound.   Biopsy of the upper outer right breast on date of 06/14/22 showed intermediate to high grade ductal carcinoma in-situ measuring 0.2 cm in greatest linear extent.  ER status: 90% positive and PR status 5% positive, both with strong staining intensity, Her2 not assessed. No lymph nodes were examined.   Accordingly, the patient was referred to Dr. Donne Hazel on 06/21/22 to discuss treatment options. Following discussion of the risks and benefits, the patient agreed to proceed with breast conserving surgery. She has been scheduled for surgery on 07/05/22 with Dr. Donne Hazel.   Of note: the patient has a family history of breast cancer in her mother and her first cousin. She also had genetic testing performed in 2019 which was negative (done at the cancer center through [facility]).   ***  PREVIOUS RADIATION THERAPY: No  PAST MEDICAL HISTORY:  has a past medical history of Anemia, Anxiety, Arthritis, Bicornate  uterus, Breast cancer ([place]) (06/2022), Family history of breast cancer, Generalized headaches, Headache, Hearing loss, History of melanoma, Insomnia, Melanoma ([place]), and Skin cancer.    PAST SURGICAL HISTORY: Past Surgical History:  Procedure Laterality Date   [place]   CYSTOSCOPY  07/23/2013   Procedure: CYSTOSCOPY;  Surgeon: Reece Packer, MD;  Location: [facility];  Service: Urology;;   DILATION AND CURETTAGE OF UTERUS     MAB x 3   LAPAROSCOPIC TOTAL HYSTERECTOMY  12/14   at [place]  07/23/2013   Procedure: LAPAROSCOPY DIAGNOSTIC;  Surgeon: Lyman Speller, MD;  Location: [facility];  Service: Gynecology;;   RADIOFREQUENCY ABLATION     Neck   SKIN BIOPSY      FAMILY HISTORY: family history includes Alzheimer's disease in her mother; Bladder Cancer in her maternal grandmother; Breast cancer (age of onset: 92) in an other family member; Breast cancer (age of onset: 81) in her mother; Cancer in her father and paternal grandmother; GER disease in her father; High blood pressure in her father; Liver cancer in her mother; Lymphoma in her paternal aunt; Osteoporosis in her mother; Other in her father and mother; Stroke in her paternal grandfather.  SOCIAL HISTORY:  reports that she has never smoked. She has never used smokeless tobacco. She reports current alcohol use. She reports that she does not use drugs.  ALLERGIES: Cephalexin  MEDICATIONS:  Current Outpatient Medications  Medication Sig Dispense Refill   acetaminophen (TYLENOL) 500 MG tablet Take 500 mg by mouth every 6 (six) hours as needed.  aspirin-acetaminophen-caffeine (EXCEDRIN MIGRAINE) 250-250-65 MG per tablet Take 2 tablets by mouth as needed.      Calcium-Phosphorus-Vitamin D (CITRACAL +D3 PO) Take 1 each by mouth daily.     ibuprofen (ADVIL,MOTRIN) 200 MG tablet Take 600 mg by mouth as needed.     Multiple Vitamins-Minerals (ONE-A-DAY WOMENS PO) Take by mouth.      SUMAtriptan (IMITREX) 100 MG tablet TAKE ONE TABLET AT ONSET OF HEADACHE. MAY REPEAT IN TWO HOURS IF NEEDED. 27 tablet 4   No current facility-administered medications for this encounter.   Facility-Administered Medications Ordered in Other Encounters  Medication Dose Route Frequency Provider Last Rate Last Admin   feeding supplement (ENSURE PRE-SURGERY) liquid 296 mL  296 mL Oral Once Rolm Bookbinder, MD        REVIEW OF SYSTEMS: As above in HPI.   PHYSICAL EXAM:  vitals were not taken for this visit.   General: Alert and oriented, in no acute distress HEENT: Head is normocephalic. Extraocular movements are intact. Oropharynx is clear. Neck: Neck is supple, no palpable cervical or supraclavicular lymphadenopathy. Heart: Regular in rate and rhythm with no murmurs, rubs, or gallops. Chest: Clear to auscultation bilaterally, with no rhonchi, wheezes, or rales. Abdomen: Soft, nontender, nondistended, with no rigidity or guarding. Extremities: No cyanosis or edema. Lymphatics: see Neck Exam Skin: No concerning lesions. Musculoskeletal: symmetric strength and muscle tone throughout. Neurologic: Cranial nerves II through XII are grossly intact. No obvious focalities. Speech is fluent. Coordination is intact. Psychiatric: Judgment and insight are intact. Affect is appropriate. Breasts: *** . No other palpable masses appreciated in the breasts or axillae *** .    ECOG = ***  0 - Asymptomatic (Fully active, able to carry on all predisease activities without restriction)  1 - Symptomatic but completely ambulatory (Restricted in physically strenuous activity but ambulatory and able to carry out work of a light or sedentary nature. For example, light housework, office work)  2 - Symptomatic, <50% in bed during the day (Ambulatory and capable of all self care but unable to carry out any work activities. Up and about more than 50% of waking hours)  3 - Symptomatic, >50% in bed, but not  bedbound (Capable of only limited self-care, confined to bed or chair 50% or more of waking hours)  4 - Bedbound (Completely disabled. Cannot carry on any self-care. Totally confined to bed or chair)  5 - Death   Eustace Pen MM, Creech RH, Tormey DC, et al. [phone]). "Toxicity and response criteria of the [facility]". [place] Oncol. 5 (6): 649-55   LABORATORY DATA:  Lab Results  Component Value Date   WBC 4.9 07/16/2013   HGB 13.3 07/16/2013   HCT 39.1 07/16/2013   MCV 86.5 07/16/2013   PLT 215 07/16/2013   CMP     Component Value Date/Time   NA 143 07/28/2019 0806   K 5.1 07/28/2019 0806   CL 105 07/28/2019 0806   CO2 22 07/28/2019 0806   GLUCOSE 74 07/28/2019 0806   BUN 19 07/28/2019 0806   CREATININE 0.59 07/28/2019 0806   CALCIUM 8.9 07/28/2019 0806   GFRNONAA 100 07/28/2019 0806   GFRAA 115 07/28/2019 0806         RADIOGRAPHY: MM RT BREAST BX W LOC DEV 1ST LESION IMAGE BX SPEC STEREO GUIDE  Addendum Date: 06/24/2022   ADDENDUM REPORT: 06/24/2022 16:50 ADDENDUM: Addendum note: Regarding RIGHT breast stereotactic guided biopsy on 06/14/2022. Patient stated on 06/17/2022 (post biopsy conversation)  she had bruising and pain in biopsy area. Electa Sniff RN  06/24/2022 Electronically Signed   By: Audie Pinto M.D.   On: 06/24/2022 16:50   Addendum Date: 06/19/2022   ADDENDUM REPORT: 06/19/2022 11:23 ADDENDUM: PATHOLOGY revealed: Breast, right, needle core biopsy, upper outer (x clip)- DUCTAL CARCINOMA IN SITU WITH CALCIFICATIONS AND NECROSIS. Comment: The biopsy has abundant hemorrhage and fibrin deposition with areas of necrosis and detached atypical epithelium; therefore, an invasive component cannot be entirely excluded. Based on the biopsy, the ductal carcinoma in situ hasapocrine features, intermediate to high nuclear grade and measures 0.2 cm in greatest linear extent. Prognostic markers (ER/PR) are pending and will be reported in an addendum. Dr.  Vic Ripper reviewed the case and agrees with the above diagnosis. Pathology results are CONCORDANT with imaging findings, per Dr. Audie Pinto. Pathology results and recommendations were discussed with patient via telephone on 06/17/2022. Patient reported biopsy site doing well with no adverse symptoms, and only slight tenderness at the site. Post biopsy care instructions were reviewed, questions were answered and my direct phone number was provided. Patient was instructed to call [facility] for any additional questions or concerns related to biopsy site. Recommendations: 1. Surgical consultation has been arranged for patient to see Dr. Rolm Bookbinder at [facility] on 06/21/2022. 2. Consider bilateral breast MRI given intermediate - high grade DCIS and to evaluate extent of breast disease. Pathology results reported by Electa Sniff RN on 06/19/2022. Electronically Signed   By: Audie Pinto M.D.   On: 06/19/2022 11:23   Result Date: 06/24/2022 CLINICAL DATA:  61 year old female presenting for biopsy of calcifications in the right breast. EXAM: RIGHT BREAST STEREOTACTIC CORE NEEDLE BIOPSY COMPARISON:  Previous exams. FINDINGS: The patient and I discussed the procedure of stereotactic-guided biopsy including benefits and alternatives. We discussed the high likelihood of a successful procedure. We discussed the risks of the procedure including infection, bleeding, tissue injury, clip migration, and inadequate sampling. Informed written consent was given. The usual time out protocol was performed immediately prior to the procedure. Using sterile technique and 1% Lidocaine as local anesthetic, under stereotactic guidance, a 9 gauge vacuum assisted device was used to perform core needle biopsy of calcifications in the upper outer right breast using a MLO approach. Specimen radiograph was performed showing 2 specimens with possible calcifications. Lesion quadrant: Upper outer  quadrant At the conclusion of the procedure, an X shaped tissue marker clip was deployed into the biopsy cavity. Follow-up 2-view mammogram was performed and dictated separately. There was a small post biopsy hematoma. Bleeding was completely stopped prior to the patient leaving the office. IMPRESSION: Stereotactic-guided biopsy of calcifications in the upper outer right breast. Electronically Signed: By: Audie Pinto M.D. On: 06/14/2022 11:45   MM CLIP PLACEMENT RIGHT  Result Date: 06/14/2022 CLINICAL DATA:  Post procedure mammogram for clip placement EXAM: 3D DIAGNOSTIC RIGHT MAMMOGRAM POST STEREOTACTIC BIOPSY COMPARISON:  Previous exam(s). FINDINGS: 3D Mammographic images were obtained following stereotactic guided biopsy of calcifications in the upper outer right breast. The biopsy marking clip is in expected position at the site of biopsy. IMPRESSION: Appropriate positioning of the X shaped biopsy marking clip at the site of biopsy in the upper outer right breast. Final Assessment: Post Procedure Mammograms for Marker Placement Electronically Signed   By: Audie Pinto M.D.   On: 06/14/2022 11:42     IMPRESSION/PLAN: ***   It was a pleasure meeting the patient today. We discussed the risks, benefits,  and side effects of radiotherapy. I recommend radiotherapy to the *** to reduce her risk of locoregional recurrence by 2/3.  We discussed that radiation would take approximately *** weeks to complete and that I would give the patient a few weeks to heal following surgery before starting treatment planning. *** If chemotherapy were to be given, this would precede radiotherapy. We spoke about acute effects including skin irritation and fatigue as well as much less common late effects including internal organ injury or irritation. We spoke about the latest technology that is used to minimize the risk of late effects for patients undergoing radiotherapy to the breast or chest wall. No guarantees of  treatment were given. The patient is enthusiastic about proceeding with treatment. I look forward to participating in the patient's care.  I will await her referral back to me for postoperative follow-up and eventual CT simulation/treatment planning.  On date of service, in total, I spent *** minutes on this encounter. Patient was seen in person.   __________________________________________   Eppie Gibson, MD  This document serves as a record of services personally performed by Eppie Gibson, MD. It was created on her behalf by Roney Mans, a trained medical scribe. The creation of this record is based on the scribe's personal observations and the provider's statements to them. This document has been checked and approved by the attending provider.

## 2022-06-30 NOTE — Progress Notes (Incomplete)
Radiation Oncology         [phone] ________________________________  Initial Outpatient Consultation  Name: Samamtha Tiegs MRN: [id number]  Date: 07/02/2022  DOB: 1961/09/08  CC:Harlan Stains, MD  Rolm Bookbinder, MD   REFERRING PHYSICIAN: Rolm Bookbinder, MD  DIAGNOSIS: No diagnosis found.  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 female who presented with a right breast abnormality on the following imaging: bilateral screening mammogram on the date of 05/28/22.  No symptoms, if any, were reported at that time.   Diagnostic right breast mammogram on 05/31/22 revealed a 1.4 cm of suspicious calcifications in the far posterior upper outer right breast. No abnormal right axillary lymph nodes were identified, however this is in the absence of targeted ultrasound.   Biopsy of the upper outer right breast on date of 06/14/22 showed intermediate to high grade ductal carcinoma in-situ measuring 0.2 cm in greatest linear extent.  ER status: 90% positive and PR status 5% positive, both with strong staining intensity, Her2 not assessed. No lymph nodes were examined.   Accordingly, the patient was referred to Dr. Donne Hazel on 06/21/22 to discuss treatment options. Following discussion of the risks and benefits, the patient agreed to proceed with breast conserving surgery. She has been scheduled for surgery on 07/05/22 with Dr. Donne Hazel.   ***  PREVIOUS RADIATION THERAPY: {EXAM; YES/NO:19492::"No"}  PAST MEDICAL HISTORY:  has a past medical history of Anemia, Anxiety, Arthritis, Bicornate uterus, Breast cancer ([place]) (06/2022), Family history of breast cancer, Generalized headaches, Headache, Hearing loss, History of melanoma, Insomnia, Melanoma ([place]), and Skin cancer.     PAST SURGICAL HISTORY: Past Surgical History:  Procedure Laterality Date  . CESAREAN SECTION  1993  . CESAREAN SECTION  1998  . CYSTOSCOPY  07/23/2013   Procedure: CYSTOSCOPY;  Surgeon: Reece Packer, MD;  Location: [facility];  Service: Urology;;  . Brigitte Pulse AND CURETTAGE OF UTERUS     MAB x 3  . LAPAROSCOPIC TOTAL HYSTERECTOMY  12/14   at [facility]  . LAPAROSCOPY  07/23/2013   Procedure: LAPAROSCOPY DIAGNOSTIC;  Surgeon: Lyman Speller, MD;  Location: [facility];  Service: Gynecology;;  . RADIOFREQUENCY ABLATION     Neck  . SKIN BIOPSY      FAMILY HISTORY: family history includes Alzheimer's disease in her mother; Bladder Cancer in her maternal grandmother; Breast cancer (age of onset: 57) in an other family member; Breast cancer (age of onset: 57) in her mother; Cancer in her father and paternal grandmother; GER disease in her father; High blood pressure in her father; Liver cancer in her mother; Lymphoma in her paternal aunt; Osteoporosis in her mother; Other in her father and mother; Stroke in her paternal grandfather.  SOCIAL HISTORY:  reports that she has never smoked. She has never used smokeless tobacco. She reports current alcohol use. She reports that she does not use drugs.  ALLERGIES: Cephalexin  MEDICATIONS:  Current Outpatient Medications  Medication Sig Dispense Refill  . acetaminophen (TYLENOL) 500 MG tablet Take 500 mg by mouth every 6 (six) hours as needed.    Marland Kitchen aspirin-acetaminophen-caffeine (EXCEDRIN MIGRAINE) 250-250-65 MG per tablet Take 2 tablets by mouth as needed.     . Calcium-Phosphorus-Vitamin D (CITRACAL +D3 PO) Take 1 each by mouth daily.    Marland Kitchen ibuprofen (  ADVIL,MOTRIN) 200 MG tablet Take 600 mg by mouth as needed.    . Multiple Vitamins-Minerals (ONE-A-DAY WOMENS PO) Take by mouth.    . SUMAtriptan (IMITREX) 100 MG tablet TAKE ONE TABLET AT ONSET OF HEADACHE. MAY REPEAT IN TWO HOURS IF NEEDED. 27 tablet 4   No current facility-administered medications for  this encounter.   Facility-Administered Medications Ordered in Other Encounters  Medication Dose Route Frequency Provider Last Rate Last Admin  . feeding supplement (ENSURE PRE-SURGERY) liquid 296 mL  296 mL Oral Once Rolm Bookbinder, MD        REVIEW OF SYSTEMS: As above in HPI.   PHYSICAL EXAM:  vitals were not taken for this visit.   General: Alert and oriented, in no acute distress HEENT: Head is normocephalic. Extraocular movements are intact. Oropharynx is clear. Neck: Neck is supple, no palpable cervical or supraclavicular lymphadenopathy. Heart: Regular in rate and rhythm with no murmurs, rubs, or gallops. Chest: Clear to auscultation bilaterally, with no rhonchi, wheezes, or rales. Abdomen: Soft, nontender, nondistended, with no rigidity or guarding. Extremities: No cyanosis or edema. Lymphatics: see Neck Exam Skin: No concerning lesions. Musculoskeletal: symmetric strength and muscle tone throughout. Neurologic: Cranial nerves II through XII are grossly intact. No obvious focalities. Speech is fluent. Coordination is intact. Psychiatric: Judgment and insight are intact. Affect is appropriate. Breasts: *** . No other palpable masses appreciated in the breasts or axillae *** .    ECOG = ***  0 - Asymptomatic (Fully active, able to carry on all predisease activities without restriction)  1 - Symptomatic but completely ambulatory (Restricted in physically strenuous activity but ambulatory and able to carry out work of a light or sedentary nature. For example, light housework, office work)  2 - Symptomatic, <50% in bed during the day (Ambulatory and capable of all self care but unable to carry out any work activities. Up and about more than 50% of waking hours)  3 - Symptomatic, >50% in bed, but not bedbound (Capable of only limited self-care, confined to bed or chair 50% or more of waking hours)  4 - Bedbound (Completely disabled. Cannot carry on any self-care. Totally  confined to bed or chair)  5 - Death   Eustace Pen MM, Creech RH, Tormey DC, et al. [phone]). "Toxicity and response criteria of the [facility]". [place] Oncol. 5 (6): 649-55   LABORATORY DATA:  Lab Results  Component Value Date   WBC 4.9 07/16/2013   HGB 13.3 07/16/2013   HCT 39.1 07/16/2013   MCV 86.5 07/16/2013   PLT 215 07/16/2013   CMP     Component Value Date/Time   NA 143 07/28/2019 0806   K 5.1 07/28/2019 0806   CL 105 07/28/2019 0806   CO2 22 07/28/2019 0806   GLUCOSE 74 07/28/2019 0806   BUN 19 07/28/2019 0806   CREATININE 0.59 07/28/2019 0806   CALCIUM 8.9 07/28/2019 0806   GFRNONAA 100 07/28/2019 0806   GFRAA 115 07/28/2019 0806         RADIOGRAPHY: MM RT BREAST BX W LOC DEV 1ST LESION IMAGE BX SPEC STEREO GUIDE  Addendum Date: 06/24/2022   ADDENDUM REPORT: 06/24/2022 16:50 ADDENDUM: Addendum note: Regarding RIGHT breast stereotactic guided biopsy on 06/14/2022. Patient stated on 06/17/2022 (post biopsy conversation) she had bruising and pain in biopsy area. Electa Sniff RN  06/24/2022 Electronically Signed   By: Audie Pinto M.D.   On: 06/24/2022 16:50   Addendum Date: 06/19/2022   ADDENDUM  REPORT: 06/19/2022 11:23 ADDENDUM: PATHOLOGY revealed: Breast, right, needle core biopsy, upper outer (x clip)- DUCTAL CARCINOMA IN SITU WITH CALCIFICATIONS AND NECROSIS. Comment: The biopsy has abundant hemorrhage and fibrin deposition with areas of necrosis and detached atypical epithelium; therefore, an invasive component cannot be entirely excluded. Based on the biopsy, the ductal carcinoma in situ hasapocrine features, intermediate to high nuclear grade and measures 0.2 cm in greatest linear extent. Prognostic markers (ER/PR) are pending and will be reported in an addendum. Dr. Vic Ripper reviewed the case and agrees with the above diagnosis. Pathology results are CONCORDANT with imaging findings, per Dr. Audie Pinto. Pathology results and recommendations  were discussed with patient via telephone on 06/17/2022. Patient reported biopsy site doing well with no adverse symptoms, and only slight tenderness at the site. Post biopsy care instructions were reviewed, questions were answered and my direct phone number was provided. Patient was instructed to call [facility] for any additional questions or concerns related to biopsy site. Recommendations: 1. Surgical consultation has been arranged for patient to see Dr. Rolm Bookbinder at [facility] on 06/21/2022. 2. Consider bilateral breast MRI given intermediate - high grade DCIS and to evaluate extent of breast disease. Pathology results reported by Electa Sniff RN on 06/19/2022. Electronically Signed   By: Audie Pinto M.D.   On: 06/19/2022 11:23   Result Date: 06/24/2022 CLINICAL DATA:  61 year old female presenting for biopsy of calcifications in the right breast. EXAM: RIGHT BREAST STEREOTACTIC CORE NEEDLE BIOPSY COMPARISON:  Previous exams. FINDINGS: The patient and I discussed the procedure of stereotactic-guided biopsy including benefits and alternatives. We discussed the high likelihood of a successful procedure. We discussed the risks of the procedure including infection, bleeding, tissue injury, clip migration, and inadequate sampling. Informed written consent was given. The usual time out protocol was performed immediately prior to the procedure. Using sterile technique and 1% Lidocaine as local anesthetic, under stereotactic guidance, a 9 gauge vacuum assisted device was used to perform core needle biopsy of calcifications in the upper outer right breast using a MLO approach. Specimen radiograph was performed showing 2 specimens with possible calcifications. Lesion quadrant: Upper outer quadrant At the conclusion of the procedure, an X shaped tissue marker clip was deployed into the biopsy cavity. Follow-up 2-view mammogram was performed and dictated separately. There was  a small post biopsy hematoma. Bleeding was completely stopped prior to the patient leaving the office. IMPRESSION: Stereotactic-guided biopsy of calcifications in the upper outer right breast. Electronically Signed: By: Audie Pinto M.D. On: 06/14/2022 11:45   MM CLIP PLACEMENT RIGHT  Result Date: 06/14/2022 CLINICAL DATA:  Post procedure mammogram for clip placement EXAM: 3D DIAGNOSTIC RIGHT MAMMOGRAM POST STEREOTACTIC BIOPSY COMPARISON:  Previous exam(s). FINDINGS: 3D Mammographic images were obtained following stereotactic guided biopsy of calcifications in the upper outer right breast. The biopsy marking clip is in expected position at the site of biopsy. IMPRESSION: Appropriate positioning of the X shaped biopsy marking clip at the site of biopsy in the upper outer right breast. Final Assessment: Post Procedure Mammograms for Marker Placement Electronically Signed   By: Audie Pinto M.D.   On: 06/14/2022 11:42     IMPRESSION/PLAN: ***   It was a pleasure meeting the patient today. We discussed the risks, benefits, and side effects of radiotherapy. I recommend radiotherapy to the *** to reduce her risk of locoregional recurrence by 2/3.  We discussed that radiation would take approximately *** weeks to complete and that I  would give the patient a few weeks to heal following surgery before starting treatment planning. *** If chemotherapy were to be given, this would precede radiotherapy. We spoke about acute effects including skin irritation and fatigue as well as much less common late effects including internal organ injury or irritation. We spoke about the latest technology that is used to minimize the risk of late effects for patients undergoing radiotherapy to the breast or chest wall. No guarantees of treatment were given. The patient is enthusiastic about proceeding with treatment. I look forward to participating in the patient's care.  I will await her referral back to me for  postoperative follow-up and eventual CT simulation/treatment planning.  On date of service, in total, I spent *** minutes on this encounter. Patient was seen in person.   __________________________________________   Eppie Gibson, MD  This document serves as a record of services personally performed by Eppie Gibson, MD. It was created on her behalf by Roney Mans, a trained medical scribe. The creation of this record is based on the scribe's personal observations and the provider's statements to them. This document has been checked and approved by the attending provider.

## 2022-07-02 ENCOUNTER — Ambulatory Visit
Admission: RE | Admit: 2022-07-02 | Discharge: 2022-07-02 | Disposition: A | Discharge status: Home / Self Care | Payer: PRIVATE HEALTH INSURANCE | Source: Ambulatory Visit | Attending: Radiation Oncology | Admitting: Radiation Oncology

## 2022-07-02 ENCOUNTER — Other Ambulatory Visit: Payer: Self-pay

## 2022-07-02 ENCOUNTER — Encounter: Payer: Self-pay | Admitting: Radiation Oncology

## 2022-07-02 VITALS — BP 101/55 | HR 87 | Temp 97.7°F | Resp 18 | Ht 68.0 in | Wt 166.1 lb

## 2022-07-02 DIAGNOSIS — Z85820 Personal history of malignant melanoma of skin: Secondary | ICD-10-CM | POA: Insufficient documentation

## 2022-07-02 DIAGNOSIS — M129 Arthropathy, unspecified: Secondary | ICD-10-CM | POA: Insufficient documentation

## 2022-07-02 DIAGNOSIS — R519 Headache, unspecified: Secondary | ICD-10-CM | POA: Diagnosis not present

## 2022-07-02 DIAGNOSIS — Z170 Estrogen receptor positive status [ER+]: Secondary | ICD-10-CM | POA: Diagnosis not present

## 2022-07-02 DIAGNOSIS — Z800 Family history of malignant neoplasm of digestive organs: Secondary | ICD-10-CM | POA: Diagnosis not present

## 2022-07-02 DIAGNOSIS — Z803 Family history of malignant neoplasm of breast: Secondary | ICD-10-CM | POA: Insufficient documentation

## 2022-07-02 DIAGNOSIS — D649 Anemia, unspecified: Secondary | ICD-10-CM | POA: Insufficient documentation

## 2022-07-02 DIAGNOSIS — Z806 Family history of leukemia: Secondary | ICD-10-CM | POA: Insufficient documentation

## 2022-07-02 DIAGNOSIS — D0511 Intraductal carcinoma in situ of right breast: Secondary | ICD-10-CM | POA: Diagnosis present

## 2022-07-02 DIAGNOSIS — G4700 Insomnia, unspecified: Secondary | ICD-10-CM | POA: Insufficient documentation

## 2022-07-03 ENCOUNTER — Inpatient Hospital Stay: Payer: PRIVATE HEALTH INSURANCE | Admitting: Licensed Clinical Social Worker

## 2022-07-03 NOTE — Progress Notes (Signed)
[facility]  Clinical Social Work was referred by medical provider for assessment of psychosocial needs.  Clinical Social Worker contacted patient by phone  to offer support and assess for needs.   Patient stated she was unavailable but would call CSW tomorrow morning.  CSW provided contact information.  Briefly informed patient of clinical social work role and that new patients were contacted regularly by support services.   Margaree Mackintosh, LCSW  Clinical Social Worker [facility]

## 2022-07-04 ENCOUNTER — Inpatient Hospital Stay: Payer: PRIVATE HEALTH INSURANCE

## 2022-07-04 ENCOUNTER — Ambulatory Visit
Admission: RE | Admit: 2022-07-04 | Discharge: 2022-07-04 | Disposition: A | Discharge status: Home / Self Care | Payer: PRIVATE HEALTH INSURANCE | Source: Ambulatory Visit | Attending: General Surgery | Admitting: General Surgery

## 2022-07-04 DIAGNOSIS — D0512 Intraductal carcinoma in situ of left breast: Secondary | ICD-10-CM

## 2022-07-04 NOTE — Progress Notes (Signed)
Suzanne Suzanne Hicks  Initial Assessment   Suzanne Suzanne Hicks is a 61 y.o. year old female contacted by phone. Clinical Social Suzanne Hicks was referred by medical provider for assessment of psychosocial needs.   SDOH (Social Determinants of Health) assessments performed: Yes SDOH Interventions    Flowsheet Row Most Recent Value  SDOH Interventions   Housing Interventions Intervention Not Indicated  Stress Interventions Offered Community Wellness Resources  Transportation Interventions Intervention Not Indicated       SDOH Screenings   Alcohol Screen: Not on file  Depression (PHQ2-9): Low Risk  (09/06/2021)   Depression (PHQ2-9)    PHQ-2 Score: 0  Financial Resource Strain: Not on file  Food Insecurity: No Food Insecurity (07/04/2022)   Hunger Vital Sign    Worried About Running Out of Food in the Last Year: Never true    Ran Out of Food in the Last Year: Never true  Housing: Low Risk  (07/04/2022)   Housing    Last Housing Risk Score: 0  Physical Activity: Not on file  Social Connections: Not on file  Stress: Stress Concern Present (07/04/2022)   [place]    Feeling of Stress : Rather much  Tobacco Use: Low Risk  (07/02/2022)   Patient History    Smoking Tobacco Use: Never    Smokeless Tobacco Use: Never    Passive Exposure: Not on file  Transportation Needs: No Transportation Needs (07/04/2022)   PRAPARE - Transportation    Lack of Transportation (Medical): No    Lack of Transportation (Non-Medical): No     Distress Screen completed: No    07/02/2022    9:14 AM  ONCBCN DISTRESS SCREENING  Screening Type Initial Screening  Distress experienced in past week (1-10) 8  Practical problem type Suzanne Hicks/school  Emotional problem type Nervousness/Anxiety;Adjusting to illness  Information Concerns Type Lack of info about diagnosis;Lack of info about treatment  Physical Problem type Pain  Physician notified of  physical symptoms Yes  Referral to clinical psychology No  Referral to clinical social Suzanne Hicks Yes  Referral to dietition No  Referral to financial advocate No  Referral to support programs Yes  Referral to palliative care No      Family/Social Information:  Housing Arrangement: patient lives with her husband. Family members/support persons in your life? Family Transportation concerns: no  Employment: Research officer, trade union.  Income source: Employment Financial concerns: No Type of concern: None Food access concerns: no Religious or spiritual practice: Not known Services Currently in place:  None  Coping/ Adjustment to diagnosis: Patient understands treatment plan and what happens next? Yes.  She is waiting for a pathology report. Concerns about diagnosis and/or treatment: Afraid of cancer.  She reports not being sick before. Patient reported stressors: Adjusting to my illness Hopes and/or priorities: Caring for her family is a priority.  Her 54 y/o father lives in [place] and is having health concerns. Patient enjoys time with family/ friends Current coping skills/ strengths: Average or above average intelligence , Capable of independent living , Communication skills , Financial means , General fund of knowledge , Motivation for treatment/growth , and Supportive family/friends     SUMMARY: Current SDOH Barriers:  None  Clinical Social Suzanne Hicks Clinical Goal(s):  Explore self care activities.  Interventions: Discussed common feeling and emotions when being diagnosed with cancer, and the importance of support during treatment Informed patient of the support team roles and support services at [facility] Provided CSW contact information and  encouraged patient to call with any questions or concerns Provided patient with information about Suzanne Suzanne Hicks and [name].  Mailed Support Group information and Suzanne Suzanne Hicks calendar.   Follow Up Plan: CSW will follow-up with patient by phone   Patient verbalizes understanding of plan: Yes    Suzanne Pickle Suzanne Jeangilles, LCSW

## 2022-07-05 ENCOUNTER — Ambulatory Visit (HOSPITAL_BASED_OUTPATIENT_CLINIC_OR_DEPARTMENT_OTHER)
Admission: RE | Admit: 2022-07-05 | Discharge: 2022-07-05 | Disposition: A | Discharge status: Home / Self Care | Payer: PRIVATE HEALTH INSURANCE | Source: Ambulatory Visit | Attending: General Surgery | Admitting: General Surgery

## 2022-07-05 ENCOUNTER — Encounter (HOSPITAL_BASED_OUTPATIENT_CLINIC_OR_DEPARTMENT_OTHER)
Admission: RE | Disposition: A | Discharge status: Home / Self Care | Payer: Self-pay | Source: Ambulatory Visit | Attending: General Surgery

## 2022-07-05 ENCOUNTER — Other Ambulatory Visit: Payer: Self-pay

## 2022-07-05 ENCOUNTER — Ambulatory Visit (HOSPITAL_BASED_OUTPATIENT_CLINIC_OR_DEPARTMENT_OTHER): Payer: PRIVATE HEALTH INSURANCE | Admitting: Certified Registered"

## 2022-07-05 ENCOUNTER — Encounter (HOSPITAL_BASED_OUTPATIENT_CLINIC_OR_DEPARTMENT_OTHER): Payer: Self-pay | Admitting: General Surgery

## 2022-07-05 ENCOUNTER — Ambulatory Visit
Admission: RE | Admit: 2022-07-05 | Discharge: 2022-07-05 | Disposition: A | Discharge status: Home / Self Care | Payer: PRIVATE HEALTH INSURANCE | Source: Ambulatory Visit | Attending: General Surgery | Admitting: General Surgery

## 2022-07-05 DIAGNOSIS — Z170 Estrogen receptor positive status [ER+]: Secondary | ICD-10-CM | POA: Diagnosis not present

## 2022-07-05 DIAGNOSIS — N6011 Diffuse cystic mastopathy of right breast: Secondary | ICD-10-CM | POA: Diagnosis not present

## 2022-07-05 DIAGNOSIS — C50911 Malignant neoplasm of unspecified site of right female breast: Secondary | ICD-10-CM | POA: Diagnosis not present

## 2022-07-05 DIAGNOSIS — D0511 Intraductal carcinoma in situ of right breast: Secondary | ICD-10-CM | POA: Diagnosis not present

## 2022-07-05 DIAGNOSIS — Z01818 Encounter for other preprocedural examination: Secondary | ICD-10-CM

## 2022-07-05 DIAGNOSIS — Z803 Family history of malignant neoplasm of breast: Secondary | ICD-10-CM | POA: Insufficient documentation

## 2022-07-05 HISTORY — PX: BREAST LUMPECTOMY WITH RADIOACTIVE SEED LOCALIZATION: SHX6424

## 2022-07-05 HISTORY — DX: Unspecified osteoarthritis, unspecified site: M19.90

## 2022-07-05 SURGERY — BREAST LUMPECTOMY WITH RADIOACTIVE SEED LOCALIZATION
Anesthesia: General | Site: Breast | Laterality: Right

## 2022-07-05 MED ORDER — LACTATED RINGERS IV SOLN: INTRAVENOUS | Status: DC

## 2022-07-05 MED ORDER — ACETAMINOPHEN 160 MG/5ML PO SOLN: 325.0000 mg | ORAL | Status: DC | PRN

## 2022-07-05 MED ORDER — ONDANSETRON HCL 4 MG/2ML IJ SOLN
INTRAMUSCULAR | Status: DC | PRN
  Administered 2022-07-05: 4 mg via INTRAVENOUS

## 2022-07-05 MED ORDER — MIDAZOLAM HCL 2 MG/2ML IJ SOLN
INTRAMUSCULAR | Status: AC
  Filled 2022-07-05: qty 2

## 2022-07-05 MED ORDER — LIDOCAINE 2% (20 MG/ML) 5 ML SYRINGE
INTRAMUSCULAR | Status: AC
  Filled 2022-07-05: qty 5

## 2022-07-05 MED ORDER — SCOPOLAMINE 1 MG/3DAYS TD PT72
1.0000 | MEDICATED_PATCH | TRANSDERMAL | Status: DC
Start: 2022-07-05 — End: 2022-07-05

## 2022-07-05 MED ORDER — BUPIVACAINE HCL (PF) 0.25 % IJ SOLN
INTRAMUSCULAR | Status: DC | PRN
  Administered 2022-07-05: 10 mL

## 2022-07-05 MED ORDER — PROPOFOL 10 MG/ML IV BOLUS
INTRAVENOUS | Status: AC
  Filled 2022-07-05: qty 20

## 2022-07-05 MED ORDER — CEFAZOLIN SODIUM-DEXTROSE 2-4 GM/100ML-% IV SOLN
INTRAVENOUS | Status: AC
  Filled 2022-07-05: qty 100

## 2022-07-05 MED ORDER — FENTANYL CITRATE (PF) 100 MCG/2ML IJ SOLN
INTRAMUSCULAR | Status: AC
  Filled 2022-07-05: qty 2

## 2022-07-05 MED ORDER — EPHEDRINE 5 MG/ML INJ
INTRAVENOUS | Status: AC
  Filled 2022-07-05: qty 10

## 2022-07-05 MED ORDER — LIDOCAINE HCL (CARDIAC) PF 100 MG/5ML IV SOSY
PREFILLED_SYRINGE | INTRAVENOUS | Status: DC | PRN
  Administered 2022-07-05: 60 mg via INTRAVENOUS

## 2022-07-05 MED ORDER — MIDAZOLAM HCL 5 MG/5ML IJ SOLN
INTRAMUSCULAR | Status: DC | PRN
  Administered 2022-07-05: 2 mg via INTRAVENOUS

## 2022-07-05 MED ORDER — ACETAMINOPHEN 325 MG PO TABS: 325.0000 mg | ORAL_TABLET | ORAL | Status: DC | PRN

## 2022-07-05 MED ORDER — BUPIVACAINE HCL (PF) 0.25 % IJ SOLN
INTRAMUSCULAR | Status: AC
  Filled 2022-07-05: qty 30

## 2022-07-05 MED ORDER — CHLORHEXIDINE GLUCONATE CLOTH 2 % EX PADS: 6.0000 | MEDICATED_PAD | Freq: Once | CUTANEOUS | Status: DC

## 2022-07-05 MED ORDER — OXYCODONE HCL 5 MG/5ML PO SOLN: 5.0000 mg | Freq: Once | ORAL | Status: AC | PRN

## 2022-07-05 MED ORDER — OXYCODONE HCL 5 MG PO TABS
ORAL_TABLET | ORAL | Status: AC
  Filled 2022-07-05: qty 1

## 2022-07-05 MED ORDER — ONDANSETRON HCL 4 MG/2ML IJ SOLN
INTRAMUSCULAR | Status: AC
  Filled 2022-07-05: qty 2

## 2022-07-05 MED ORDER — ACETAMINOPHEN 10 MG/ML IV SOLN: 1000.0000 mg | Freq: Once | INTRAVENOUS | Status: DC | PRN

## 2022-07-05 MED ORDER — CEFAZOLIN SODIUM-DEXTROSE 2-4 GM/100ML-% IV SOLN
2.0000 g | INTRAVENOUS | Status: AC
  Administered 2022-07-05: 2 g via INTRAVENOUS

## 2022-07-05 MED ORDER — DEXAMETHASONE SODIUM PHOSPHATE 10 MG/ML IJ SOLN
INTRAMUSCULAR | Status: DC | PRN
  Administered 2022-07-05: 10 mg via INTRAVENOUS

## 2022-07-05 MED ORDER — OXYCODONE HCL 5 MG PO TABS
5.0000 mg | ORAL_TABLET | Freq: Once | ORAL | Status: AC | PRN
  Administered 2022-07-05: 5 mg via ORAL

## 2022-07-05 MED ORDER — FENTANYL CITRATE (PF) 100 MCG/2ML IJ SOLN
INTRAMUSCULAR | Status: DC | PRN
  Administered 2022-07-05: 25 ug via INTRAVENOUS
  Administered 2022-07-05: 50 ug via INTRAVENOUS
  Administered 2022-07-05: 25 ug via INTRAVENOUS

## 2022-07-05 MED ORDER — PROMETHAZINE HCL 25 MG/ML IJ SOLN: 6.2500 mg | INTRAMUSCULAR | Status: DC | PRN

## 2022-07-05 MED ORDER — DEXAMETHASONE SODIUM PHOSPHATE 10 MG/ML IJ SOLN
INTRAMUSCULAR | Status: AC
  Filled 2022-07-05: qty 1

## 2022-07-05 MED ORDER — FENTANYL CITRATE (PF) 100 MCG/2ML IJ SOLN
25.0000 ug | INTRAMUSCULAR | Status: DC | PRN
  Administered 2022-07-05 (×2): 25 ug via INTRAVENOUS

## 2022-07-05 MED ORDER — EPHEDRINE SULFATE (PRESSORS) 50 MG/ML IJ SOLN
INTRAMUSCULAR | Status: DC | PRN
  Administered 2022-07-05: 5 mg via INTRAVENOUS

## 2022-07-05 MED ORDER — AMISULPRIDE (ANTIEMETIC) 5 MG/2ML IV SOLN: 10.0000 mg | Freq: Once | INTRAVENOUS | Status: DC | PRN

## 2022-07-05 MED ORDER — ACETAMINOPHEN 500 MG PO TABS: 1000.0000 mg | ORAL_TABLET | ORAL | Status: DC

## 2022-07-05 MED ORDER — PROPOFOL 10 MG/ML IV BOLUS
INTRAVENOUS | Status: DC | PRN
  Administered 2022-07-05: 180 mg via INTRAVENOUS

## 2022-07-05 MED ORDER — ACETAMINOPHEN 500 MG PO TABS
ORAL_TABLET | ORAL | Status: AC
  Filled 2022-07-05: qty 2

## 2022-07-05 SURGICAL SUPPLY — 40 items
APPLIER CLIP 9.375 MED OPEN (MISCELLANEOUS) ×2
BLADE SURG 15 STRL LF DISP TIS (BLADE) ×1 IMPLANT
BLADE SURG 15 STRL SS (BLADE) ×1
CHLORAPREP W/TINT 26 (MISCELLANEOUS) ×2 IMPLANT
CLIP APPLIE 9.375 MED OPEN (MISCELLANEOUS) IMPLANT
COVER BACK TABLE 60X90IN (DRAPES) ×2 IMPLANT
COVER MAYO STAND STRL (DRAPES) ×2 IMPLANT
COVER PROBE W GEL 5X96 (DRAPES) ×2 IMPLANT
DERMABOND ADVANCED (GAUZE/BANDAGES/DRESSINGS) ×1
DERMABOND ADVANCED .7 DNX12 (GAUZE/BANDAGES/DRESSINGS) ×1 IMPLANT
DRAPE LAPAROSCOPIC ABDOMINAL (DRAPES) ×2 IMPLANT
DRAPE UTILITY XL STRL (DRAPES) ×2 IMPLANT
ELECT COATED BLADE 2.86 ST (ELECTRODE) ×2 IMPLANT
ELECT REM PT RETURN 9FT ADLT (ELECTROSURGICAL) ×2
ELECTRODE REM PT RTRN 9FT ADLT (ELECTROSURGICAL) ×1 IMPLANT
GLOVE BIO SURGEON STRL SZ7 (GLOVE) ×4 IMPLANT
GLOVE BIOGEL PI IND STRL 7.5 (GLOVE) ×1 IMPLANT
GLOVE BIOGEL PI INDICATOR 7.5 (GLOVE) ×1
GOWN STRL REUS W/ TWL LRG LVL3 (GOWN DISPOSABLE) ×2 IMPLANT
GOWN STRL REUS W/TWL LRG LVL3 (GOWN DISPOSABLE) ×2
HEMOSTAT ARISTA ABSORB 3G PWDR (HEMOSTASIS) IMPLANT
KIT MARKER MARGIN INK (KITS) ×2 IMPLANT
NDL HYPO 25X1 1.5 SAFETY (NEEDLE) ×1 IMPLANT
NEEDLE HYPO 25X1 1.5 SAFETY (NEEDLE) ×2 IMPLANT
PACK BASIN DAY SURGERY FS (CUSTOM PROCEDURE TRAY) ×2 IMPLANT
PENCIL SMOKE EVACUATOR (MISCELLANEOUS) ×2 IMPLANT
SLEEVE SCD COMPRESS KNEE MED (STOCKING) ×2 IMPLANT
SPONGE T-LAP 4X18 ~~LOC~~+RFID (SPONGE) ×2 IMPLANT
STRIP CLOSURE SKIN 1/2X4 (GAUZE/BANDAGES/DRESSINGS) ×2 IMPLANT
SUT MNCRL AB 4-0 PS2 18 (SUTURE) ×2 IMPLANT
SUT SILK 2 0 SH (SUTURE) ×1 IMPLANT
SUT VIC AB 2-0 SH 27 (SUTURE) ×2
SUT VIC AB 2-0 SH 27XBRD (SUTURE) ×1 IMPLANT
SUT VIC AB 3-0 SH 27 (SUTURE) ×1
SUT VIC AB 3-0 SH 27X BRD (SUTURE) ×1 IMPLANT
SUT VIC AB 5-0 PS2 18 (SUTURE) IMPLANT
SYR CONTROL 10ML LL (SYRINGE) ×2 IMPLANT
TOWEL GREEN STERILE FF (TOWEL DISPOSABLE) ×2 IMPLANT
TRAY FAXITRON CT DISP (TRAY / TRAY PROCEDURE) ×2 IMPLANT
YANKAUER SUCT BULB TIP NO VENT (SUCTIONS) IMPLANT

## 2022-07-05 NOTE — Anesthesia Procedure Notes (Signed)
Procedure Name: LMA Insertion Date/Time: 07/05/2022 12:44 PM  Performed by: Lavonia Dana, CRNAPre-anesthesia Checklist: Patient identified, Emergency Drugs available, Suction available and Patient being monitored Patient Re-evaluated:Patient Re-evaluated prior to induction Oxygen Delivery Method: Circle system utilized Preoxygenation: Pre-oxygenation with 100% oxygen Induction Type: IV induction Ventilation: Mask ventilation without difficulty LMA: LMA inserted LMA Size: 4.0 Number of attempts: 1 Airway Equipment and Method: Bite block Placement Confirmation: positive ETCO2 Tube secured with: Tape Dental Injury: Teeth and Oropharynx as per pre-operative assessment

## 2022-07-05 NOTE — Anesthesia Postprocedure Evaluation (Signed)
Anesthesia Post Note  Patient: Suzanne Hicks  Procedure(s) Performed: RIGHT BREAST SEED GUIDED LUMPECTOMY (Right: Breast)     Patient location during evaluation: PACU Anesthesia Type: General Level of consciousness: awake and alert Pain management: pain level controlled Vital Signs Assessment: post-procedure vital signs reviewed and stable Respiratory status: spontaneous breathing, nonlabored ventilation, respiratory function stable and patient connected to nasal cannula oxygen Cardiovascular status: blood pressure returned to baseline and stable Postop Assessment: no apparent nausea or vomiting Anesthetic complications: no   No notable events documented.  Last Vitals:  Vitals:   07/05/22 1418 07/05/22 1430  BP:    Pulse: 63 69  Resp: 10 18  Temp:    SpO2: 99% 100%    Last Pain:  Vitals:   07/05/22 1338  TempSrc:   PainSc: 0-No pain                 Effie Berkshire

## 2022-07-05 NOTE — H&P (Signed)
61 year old female who has a family history of breast cancer in her mother and her first cousin. She actually has negative genetic testing in 2019 that was done at the cancer center through [facility] that was negative. She had no mass or discharge at all. She has no real prior breast history for herself either. She underwent a mammogram that shows B density breast. In the right upper outer quadrant there is a 1.4 x 0.8 cm area of pleomorphic calcifications. A stereotactic core biopsy was done with placement of a X marking clip. The biopsy results show her to have intermediate to high-grade ductal carcinoma in situ that is ER positive at 90% and 5% is PR positive. Due to the fact that the biopsy had abundant hemorrhage and some areas of necrosis the pathology report states an invasive component cannot be entirely excluded. She is here with a friend to discuss options. She has been on a high risk screening protocol due to her family history and has a MRI done in December 2022 that was negative.  Review of Systems: A complete review of systems was obtained from the patient. I have reviewed this information and discussed as appropriate with the patient. See HPI as well for other ROS.  Review of Systems  HENT: Positive for hearing loss.  Musculoskeletal: Positive for neck pain.  Neurological: Positive for headaches.  All other systems reviewed and are negative.   Medical History: Past Medical History:  Diagnosis Date  Arthritis  Chronic neck pain  Melanoma (CMS-HCC)   There is no problem list on file for this patient.  Past Surgical History:  Procedure Laterality Date  CESAREAN SECTION  Excision of Melanoma lesion  HYSTERECTOMY    Allergies  Allergen Reactions  Cephalexin Itching, Rash and Hives   Current Outpatient Medications on File Prior to Visit  Medication Sig Dispense Refill  aspirin-acetaminophen-caffeine (EXCEDRIN EXTRA STRENGTH) 250-250-65 mg per tablet Take 2 tablets by mouth  once daily as needed for Pain  aspirin/acetaminophen/caffeine (EXCEDRIN MIGRAINE ORAL) Take by mouth  calcium carbonate-vitamin D3 (OS-CAL 500+D) 500 mg(1,'250mg'$ ) -200 unit tablet Take 1 tablet by mouth 2 (two) times daily with meals  meloxicam (MOBIC) 15 MG tablet Take 15 mg by mouth once daily as needed for Pain  multivitamin capsule Take 1 capsule by mouth once daily  SUMAtriptan (IMITREX) 100 MG tablet Take 100 mg by mouth once daily as needed for Migraine May take a second dose after 2 hours if needed.    Family History  Problem Relation Age of Onset  Hyperlipidemia (Elevated cholesterol) Mother  Breast cancer Mother  High blood pressure (Hypertension) Father  Prostate cancer Father    Social History   Tobacco Use  Smoking Status Never  Smokeless Tobacco Never  Marital status: Married  Occupational History  Occupation: English as a second language teacher  Tobacco Use  Smoking status: Never  Smokeless tobacco: Never  Vaping Use  Vaping Use: Never used  Substance and Sexual Activity  Alcohol use: Yes  Drug use: Never   Objective:   Vitals:  06/21/22 1203  BP: (!) 142/80  Pulse: 94  Temp: 36.7 C (98 F)  SpO2: 98%  Weight: 75.8 kg (167 lb 3.2 oz)  Height: 172.7 cm ('5\' 8"'$ )   Body mass index is 25.42 kg/m.  Physical Exam Vitals reviewed.  Constitutional:  Appearance: Normal appearance.  Chest:  Breasts: Right: No inverted nipple, mass or nipple discharge.  Left: No inverted nipple, mass or nipple discharge.  Lymphadenopathy:  Upper Body:  Right upper  body: No supraclavicular or axillary adenopathy.  Left upper body: No supraclavicular or axillary adenopathy.  Neurological:  Mental Status: She is alert.    Assessment and Plan:   DCIS right breast  Right breast seed guided lumpectomy  We discussed the staging and pathophysiology of breast cancer. We discussed all of the different options for treatment for breast cancer including surgery, chemotherapy, radiation therapy,  Herceptin, and antiestrogen therapy. We discussed a sentinel lymph node biopsy and I dont think needs that now. She understands this may be required if invasive cancer is found on excision. We discussed the options for treatment of the breast cancer which included lumpectomy versus a mastectomy. We discussed the performance of the lumpectomy with radioactive seed placement. We discussed a 5-10% chance of a positive margin requiring reexcision in the operating room. We also discussed that she will likely need radiation therapy if she undergoes lumpectomy. I am going to have her see rad onc prior to surgery as well. We discussed mastectomy and the postoperative care for that as well. Mastectomy can be followed by reconstruction. Most mastectomy patients will not need radiation therapy. We discussed that there is no difference in her survival whether she undergoes lumpectomy with radiation therapy or antiestrogen therapy versus a mastectomy. There is also no real difference between her recurrence in the breast. We discussed the risks of operation including bleeding, infection, possible reoperation. She understands her further therapy will be based on what her stages at the time of her operation.

## 2022-07-05 NOTE — Progress Notes (Signed)
As Agricultural consultant, spoke with patient regarding pain medicines ordered, pain scale, prescribed post op care.

## 2022-07-05 NOTE — Transfer of Care (Signed)
Immediate Anesthesia Transfer of Care Note  Patient: Laiana Fratus  Procedure(s) Performed: RIGHT BREAST SEED GUIDED LUMPECTOMY (Right: Breast)  Patient Location: PACU  Anesthesia Type:General  Level of Consciousness: drowsy  Airway & Oxygen Therapy: Patient Spontanous Breathing and Patient connected to face mask oxygen  Post-op Assessment: Report given to RN and Post -op Vital signs reviewed and stable  Post vital signs: Reviewed and stable  Last Vitals:  Vitals Value Taken Time  BP 118/59 07/05/22 1338  Temp 36.6 C 07/05/22 1338  Pulse 75 07/05/22 1339  Resp 12 07/05/22 1341  SpO2 100 % 07/05/22 1339  Vitals shown include unvalidated device data.  Last Pain:  Vitals:   07/05/22 1055  TempSrc: Oral  PainSc: 0-No pain      Patients Stated Pain Goal:  (will not rate. Only says minor, medium or extreme) (02/77/41 2878)  Complications: No notable events documented.

## 2022-07-05 NOTE — Anesthesia Preprocedure Evaluation (Addendum)
Anesthesia Evaluation  Patient identified by MRN, date of birth, ID band Patient awake    Reviewed: Allergy & Precautions, NPO status , Patient's Chart, lab work & pertinent test results  Airway Mallampati: II  TM Distance: >3 FB Neck ROM: Full    Dental  (+) Teeth Intact, Dental Advisory Given   Pulmonary neg pulmonary ROS,    Pulmonary exam normal        Cardiovascular negative cardio ROS   Rhythm:Regular Rate:Normal     Neuro/Psych  Headaches, PSYCHIATRIC DISORDERS Anxiety    GI/Hepatic negative GI ROS, Neg liver ROS,   Endo/Other  negative endocrine ROS  Renal/GU negative Renal ROS     Musculoskeletal  (+) Arthritis ,   Abdominal Normal abdominal exam  (+)   Peds  Hematology negative hematology ROS (+)   Anesthesia Other Findings   Reproductive/Obstetrics                            Anesthesia Physical Anesthesia Plan  ASA: 2  Anesthesia Plan: General   Post-op Pain Management:    Induction: Intravenous  PONV Risk Score and Plan: 4 or greater and Ondansetron, Dexamethasone, Midazolam, Treatment may vary due to age or medical condition and Scopolamine patch - Pre-op  Airway Management Planned: LMA  Additional Equipment: None  Intra-op Plan:   Post-operative Plan: Extubation in OR  Informed Consent: I have reviewed the patients History and Physical, chart, labs and discussed the procedure including the risks, benefits and alternatives for the proposed anesthesia with the patient or authorized representative who has indicated his/her understanding and acceptance.     Dental advisory given  Plan Discussed with: CRNA  Anesthesia Plan Comments:         Anesthesia Quick Evaluation

## 2022-07-05 NOTE — Interval H&P Note (Signed)
History and Physical Interval Note:  07/05/2022 5:06 PM  Suzanne Hicks  has presented today for surgery, with the diagnosis of RIGHT BREAST DUCTAL CARCINOMA IN SITU.  The various methods of treatment have been discussed with the patient and family. After consideration of risks, benefits and other options for treatment, the patient has consented to  Procedure(s): RIGHT BREAST SEED GUIDED LUMPECTOMY (Right) as a surgical intervention.  The patient's history has been reviewed, patient examined, no change in status, stable for surgery.  I have reviewed the patient's chart and labs.  Questions were answered to the patient's satisfaction.     Rolm Bookbinder

## 2022-07-05 NOTE — Op Note (Signed)
Preoperative diagnosis: stage 0 right breast cancer Postoperative diagnosis: Same as above Procedure  Right breast radioactive seed guided lumpectomy  Surgeon: Dr. Serita Grammes Anesthesia: General with a pectoral block Estimated blood loss: 50 cc Specimens: 1.  Right  breast seed guided lumpectomy containing seed and clip marked with paint 2. Additional right breast superior and medial margins marked short superior, long lateral, double deep Complications: None Drains: None Special count was correct completion Disposition to recovery in stable condition  Indications: 61 year old female who has a family history of breast cancer in her mother and her first cousin. She actually has negative genetic testing in 2019 that was done at the cancer center through [facility] that was negative. She underwent a mammogram that shows B density breast. In the right upper outer quadrant there is a 1.4 x 0.8 cm area of pleomorphic calcifications. A stereotactic core biopsy was done with placement of a X marking clip. The biopsy results show her to have intermediate to high-grade ductal carcinoma in situ that is ER positive at 90% and 5% is PR positive. Due to the fact that the biopsy had abundant hemorrhage and some areas of necrosis the pathology report states an invasive component cannot be entirely excluded. She has been on a high risk screening protocol due to her family history and has a MRI done in December 2022 that was negative. We have elected to proceed with lumpectomy.   Procedure: After informed consent was obtained the patient was taken to the OR.  She was given antibiotics.  SCDs were in place.  She was placed under general anesthesia without complication.  She was prepped and draped in the standard sterile surgical fashion.  Surgical timeout was then performed.   I identified the seed in the very upper outer quadrant.  I made an incision in the very upper outer breast.   I then dissected to the seed. I  removed the seed and the surrounding tissue with an attempt to get a clear margin. Mammogram confirmed removal of the seed and the clip. I did remove additional margins as above due to the appearance on 3d imaging.  The posterior margin is the muscle.  The lateral border is the axilla and the anterior border is skin.  Hemostasis was observed. I closed the breast tissue with 2-0 vicryl. I did place a clip in the cavity.   I then closed skin with 3-0 vicryl and 4-0 monocryl. Glue was placed.   she tolerated well, was extubated and transferred to recovery stable

## 2022-07-05 NOTE — Discharge Instructions (Addendum)
Oxycodone given @ 3:20pm  [place] Office Phone Number [phone]  POST OP INSTRUCTIONS Take 400 mg of ibuprofen every 8 hours or 650 mg tylenol every 6 hours for next 72 hours then as needed. Use ice several times daily also.  A prescription for pain medication may be given to you upon discharge.  Take your pain medication as prescribed, if needed.  If narcotic pain medicine is not needed, then you may take acetaminophen (Tylenol), naprosyn (Alleve) or ibuprofen (Advil) as needed. Take your usually prescribed medications unless otherwise directed If you need a refill on your pain medication, please contact your pharmacy.  They will contact our office to request authorization.  Prescriptions will not be filled after 5pm or on week-ends. You should eat very light the first 24 hours after surgery, such as soup, crackers, pudding, etc.  Resume your normal diet the day after surgery. Most patients will experience some swelling and bruising in the breast.  Ice packs and a good support bra will help.  Wear the breast binder provided or a sports bra for 72 hours day and night.  After that wear a sports bra during the day until you return to the office. Swelling and bruising can take several days to resolve.  It is common to experience some constipation if taking pain medication after surgery.  Increasing fluid intake and taking a stool softener will usually help or prevent this problem from occurring.  A mild laxative (Milk of Magnesia or Miralax) should be taken according to package directions if there are no bowel movements after 48 hours. I used skin glue on the incision, you may shower in 24 hours.  The glue will flake off over the next 2-3 weeks.  Any sutures or staples will be removed at the office during your follow-up visit. ACTIVITIES:  You may resume regular daily activities (gradually increasing) beginning the next day.  Wearing a good support bra or sports bra minimizes pain  and swelling.  You may have sexual intercourse when it is comfortable. You may drive when you no longer are taking prescription pain medication, you can comfortably wear a seatbelt, and you can safely maneuver your car and apply brakes. RETURN TO WORK:  ______________________________________________________________________________________ Dennis Bast should see your doctor in the office for a follow-up appointment approximately two weeks after your surgery.  Your doctor's nurse will typically make your follow-up appointment when she calls you with your pathology report.  Expect your pathology report 3-4 business days after your surgery.  You may call to check if you do not hear from [place] after three days. OTHER INSTRUCTIONS: _______________________________________________________________________________________________ _____________________________________________________________________________________________________________________________________ _____________________________________________________________________________________________________________________________________ _____________________________________________________________________________________________________________________________________  WHEN TO CALL DR WAKEFIELD: Fever over 101.0 Nausea and/or vomiting. Extreme swelling or bruising. Continued bleeding from incision. Increased pain, redness, or drainage from the incision.  The clinic staff is available to answer your questions during regular business hours.  Please don't hesitate to call and ask to speak to one of the nurses for clinical concerns.  If you have a medical emergency, go to the nearest emergency room or call 911.  A surgeon from [facility] is always on call at the hospital.  For further questions, please visit [email or web address]   Post Anesthesia Home Care Instructions  Activity: Get plenty of rest for the remainder of the day. A  responsible individual must stay with you for 24 hours following the procedure.  For the next 24 hours, DO NOT: -Drive a car -Paediatric nurse -Drink alcoholic beverages -Take  any medication unless instructed by your physician -Make any legal decisions or sign important papers.  Meals: Start with liquid foods such as gelatin or soup. Progress to regular foods as tolerated. Avoid greasy, spicy, heavy foods. If nausea and/or vomiting occur, drink only clear liquids until the nausea and/or vomiting subsides. Call your physician if vomiting continues.  Special Instructions/Symptoms: Your throat may feel dry or sore from the anesthesia or the breathing tube placed in your throat during surgery. If this causes discomfort, gargle with warm salt water. The discomfort should disappear within 24 hours.  If you had a scopolamine patch placed behind your ear for the management of post- operative nausea and/or vomiting:  1. The medication in the patch is effective for 72 hours, after which it should be removed.  Wrap patch in a tissue and discard in the trash. Wash hands thoroughly with soap and water. 2. You may remove the patch earlier than 72 hours if you experience unpleasant side effects which may include dry mouth, dizziness or visual disturbances. 3. Avoid touching the patch. Wash your hands with soap and water after contact with the patch.

## 2022-07-08 ENCOUNTER — Encounter (HOSPITAL_BASED_OUTPATIENT_CLINIC_OR_DEPARTMENT_OTHER): Payer: Self-pay | Admitting: General Surgery

## 2022-07-08 LAB — SURGICAL PATHOLOGY

## 2022-07-12 ENCOUNTER — Telehealth: Payer: Self-pay | Admitting: *Deleted

## 2022-07-12 NOTE — Telephone Encounter (Signed)
Received message from patient with request for a return phone call.  Called patient and she states she has been trying to reach the radiation department to get Dr. Lanell Persons opinion on med onc. She was very frustrated with the telephone system.  Informed her I would let the appropriate person know if her concerns.  She wanted the names of the med onc and I gave those to her and my direct phone number to call when she makes a decision. Explained to her what my role as a navigator was. She states she is very particular about things and wanted to make sure she was making the best decision. She will call when she makes a decision.

## 2022-07-15 ENCOUNTER — Telehealth: Payer: Self-pay

## 2022-07-15 ENCOUNTER — Telehealth: Payer: Self-pay | Admitting: Hematology and Oncology

## 2022-07-15 ENCOUNTER — Encounter: Payer: Self-pay | Admitting: *Deleted

## 2022-07-15 NOTE — Telephone Encounter (Signed)
Left VM on patient's cell phone. Provided my direct call back number should she have any questions/concerns she would like me to relay to Dr. Isidore Moos. Also informed her I had sent a MyChart message that she could respond to if that was easier.

## 2022-07-15 NOTE — Telephone Encounter (Signed)
Scheduled appt per 7/28 staff msg with navigator. Pt is aware of appt date and time. Pt is aware to arrive 15 mins prior to appt time and to bring and updated insurance card. Pt is aware of appt location.

## 2022-07-16 ENCOUNTER — Encounter (HOSPITAL_COMMUNITY): Payer: Self-pay

## 2022-07-30 ENCOUNTER — Inpatient Hospital Stay: Payer: PRIVATE HEALTH INSURANCE | Attending: Hematology and Oncology | Admitting: Hematology and Oncology

## 2022-07-30 ENCOUNTER — Other Ambulatory Visit: Payer: Self-pay

## 2022-07-30 DIAGNOSIS — Z807 Family history of other malignant neoplasms of lymphoid, hematopoietic and related tissues: Secondary | ICD-10-CM | POA: Diagnosis not present

## 2022-07-30 DIAGNOSIS — Z8052 Family history of malignant neoplasm of bladder: Secondary | ICD-10-CM | POA: Insufficient documentation

## 2022-07-30 DIAGNOSIS — Z85828 Personal history of other malignant neoplasm of skin: Secondary | ICD-10-CM | POA: Diagnosis not present

## 2022-07-30 DIAGNOSIS — Z803 Family history of malignant neoplasm of breast: Secondary | ICD-10-CM | POA: Diagnosis not present

## 2022-07-30 DIAGNOSIS — Z8042 Family history of malignant neoplasm of prostate: Secondary | ICD-10-CM | POA: Insufficient documentation

## 2022-07-30 DIAGNOSIS — D0511 Intraductal carcinoma in situ of right breast: Secondary | ICD-10-CM | POA: Diagnosis present

## 2022-07-30 NOTE — Progress Notes (Signed)
Radiation Oncology         [phone] ________________________________  Name: Suzanne Hicks MRN: [id number]  Date: 07/31/2022  DOB: 1961-06-07  Follow-Up Visit Note  Outpatient  CC: Harlan Stains, MD  Rolm Bookbinder, MD  Diagnosis:   No diagnosis found.   S/p lumpectomy: Stage 0 (cTis (DCIS), cN0, cM0) Right Breast, High-grade ductal carcinoma in-situ, ER+ / PR+ / Her2 not assessed  CHIEF COMPLAINT: Here to discuss management of right breast DCIS  Narrative:  The patient returns today for follow-up.     Since consultation date of 07/02/22, the patient opted to proceed with right breast lumpectomy without nodal biopsies on 07/05/22 under the care of Dr. Donne Hazel. Pathology from the procedure revealed: tumor size of 5 mm; histology of high-grade ductal carcinoma in-situ with clear margins of resection; margin status to in situ disease of 5 mm from the posterior margin; no lymph nodes were examined;  ER status: 90% positive; PR status 5% positive, Her2 not assessed.  During a recent follow-up with Dr. Donne Hazel on 07/22/22, the patient was noted to be doing well from a post-op standpoint, and without evidence of infection or seroma on examination.   Symptomatically, the patient reports: ***        ALLERGIES:  is allergic to cephalexin.  Meds: Current Outpatient Medications  Medication Sig Dispense Refill   acetaminophen (TYLENOL) 500 MG tablet Take 500 mg by mouth every 6 (six) hours as needed.     aspirin-acetaminophen-caffeine (EXCEDRIN MIGRAINE) 250-250-65 MG per tablet Take 2 tablets by mouth as needed.      Calcium-Phosphorus-Vitamin D (CITRACAL +D3 PO) Take 1 each by mouth daily.     ibuprofen (ADVIL,MOTRIN) 200 MG tablet Take 600 mg by mouth as needed.     Multiple Vitamins-Minerals (ONE-A-DAY WOMENS PO) Take by mouth.     SUMAtriptan (IMITREX) 100 MG tablet TAKE ONE TABLET AT ONSET OF HEADACHE. MAY REPEAT IN TWO HOURS IF NEEDED. 27 tablet 4   No current  facility-administered medications for this encounter.    Physical Findings:  vitals were not taken for this visit. .     General: Alert and oriented, in no acute distress HEENT: Head is normocephalic. Extraocular movements are intact. Oropharynx is clear. Neck: Neck is supple, no palpable cervical or supraclavicular lymphadenopathy. Heart: Regular in rate and rhythm with no murmurs, rubs, or gallops. Chest: Clear to auscultation bilaterally, with no rhonchi, wheezes, or rales. Abdomen: Soft, nontender, nondistended, with no rigidity or guarding. Extremities: No cyanosis or edema. Lymphatics: see Neck Exam Musculoskeletal: symmetric strength and muscle tone throughout. Neurologic: No obvious focalities. Speech is fluent.  Psychiatric: Judgment and insight are intact. Affect is appropriate. Breast exam reveals ***  Lab Findings: Lab Results  Component Value Date   WBC 4.9 07/16/2013   HGB 13.3 07/16/2013   HCT 39.1 07/16/2013   MCV 86.5 07/16/2013   PLT 215 07/16/2013    '@LASTCHEMISTRY' @  Radiographic Findings: MM BREAST SURGICAL SPECIMEN  Result Date: 07/05/2022 CLINICAL DATA:  History of RIGHT breast DCIS status post lumpectomy today after earlier radioactive seed localization. EXAM: SPECIMEN RADIOGRAPH OF THE RIGHT BREAST COMPARISON:  Previous exam(s). FINDINGS: Status post excision of the right breast. The radioactive seed and biopsy marker clip are present within the specimen. IMPRESSION: Specimen radiograph of the right breast. Electronically Signed   By: Franki Cabot M.D.   On: 07/05/2022 16:32  MM RT RADIOACTIVE SEED LOC MAMMO GUIDE  Result Date: 07/04/2022 CLINICAL DATA:  61 year old female with newly  diagnosed right breast DCIS. Patient presents for seed localization of the right breast prior to lumpectomy. EXAM: MAMMOGRAPHIC GUIDED RADIOACTIVE SEED LOCALIZATION OF THE RIGHT BREAST COMPARISON:  Previous exam(s). FINDINGS: Patient presents for radioactive seed  localization prior to . I met with the patient and we discussed the procedure of seed localization including benefits and alternatives. We discussed the high likelihood of a successful procedure. We discussed the risks of the procedure including infection, bleeding, tissue injury and further surgery. We discussed the low dose of radioactivity involved in the procedure. Informed, written consent was given. The usual time-out protocol was performed immediately prior to the procedure. Using mammographic guidance, sterile technique, 1% lidocaine and an I-125 radioactive seed, the X shaped biopsy marking clip was localized using a MLO approach. The follow-up mammogram images confirm the seed in the expected location and were marked for Dr. Donne Hazel. Follow-up survey of the patient confirms presence of the radioactive seed. Order number of I-125 seed:  [id number]. Total activity: 0.251 mCi reference Date: May 23, 2022 The patient tolerated the procedure well and was released from the [place]. She was given instructions regarding seed removal. IMPRESSION: Radioactive seed localization right breast. No apparent complications. Electronically Signed   By: Audie Pinto M.D.   On: 07/04/2022 14:18   Impression/Plan: We discussed adjuvant radiotherapy today.  I recommend *** in order to ***.  I reviewed the logistics, benefits, risks, and potential side effects of this treatment in detail. Risks may include but not necessary be limited to acute and late injury tissue in the radiation fields such as skin irritation (change in color/pigmentation, itching, dryness, pain, peeling). She may experience fatigue. We also discussed possible risk of long term cosmetic changes or scar tissue. There is also a smaller risk for lung toxicity, ***cardiac toxicity, ***brachial plexopathy, ***lymphedema, ***musculoskeletal changes, ***rib fragility or ***induction of a second malignancy, ***late chronic non-healing soft tissue  wound.    The patient asked good questions which I answered to her satisfaction. She is enthusiastic about proceeding with treatment. A consent form has been *** signed and placed in her chart.  A total of *** medically necessary complex treatment devices will be fabricated and supervised by me: *** fields with MLCs for custom blocks to protect heart, and lungs;  and, a Vac-lok. MORE COMPLEX DEVICES MAY BE MADE IN DOSIMETRY FOR FIELD IN FIELD BEAMS FOR DOSE HOMOGENEITY.  I have requested : 3D Simulation which is medically necessary to give adequate dose to at risk tissues while sparing lungs and heart.  I have requested a DVH of the following structures: lungs, heart, *** lumpectomy cavity.    The patient will receive *** Gy in *** fractions to the *** with *** fields.  This will be *** followed by a boost.  On date of service, in total, I spent *** minutes on this encounter. Patient was seen in person.  _____________________________________   Eppie Gibson, MD  This document serves as a record of services personally performed by Eppie Gibson, MD. It was created on her behalf by Roney Mans, a trained medical scribe. The creation of this record is based on the scribe's personal observations and the provider's statements to them. This document has been checked and approved by the attending provider.

## 2022-07-30 NOTE — Assessment & Plan Note (Addendum)
07/05/2022:Right lumpectomy: 5 mm high-grade DCIS with clear margins ER 90%, PR 5%  Pathology review: I discussed with the patient the difference between DCIS and invasive breast cancer. It is considered a precancerous lesion. DCIS is classified as a 0. It is generally detected through mammograms as calcifications. We discussed the significance of grades and its impact on prognosis. We also discussed the importance of ER and PR receptors and their implications to adjuvant treatment options. Prognosis of DCIS dependence on grade, comedo necrosis. It is anticipated that if not treated, 20-30% of DCIS can develop into invasive breast cancer.  Recommendation: 1. Adjuvant radiation therapy 2. Followed by antiestrogen therapy with tamoxifen versus anastrozole 5 years  After much discussion patient is leaning towards to using anastrozole instead.  Return to clinic after radiation to start antiestrogen therapy

## 2022-07-30 NOTE — Progress Notes (Signed)
[facility] NOTE  Patient Care Team: Harlan Stains, MD as PCP - General (Family Medicine)  CHIEF COMPLAINTS/PURPOSE OF CONSULTATION:  Newly diagnosed right breast DCIS  HISTORY OF PRESENTING ILLNESS:  Suzanne Hicks 61 y.o. female is here because of recent diagnosis of right breast DCIS.  She had a screening mammogram detected abnormality in the right breast which was subsequently evaluated by ultrasound and biopsy.  She underwent a lumpectomy with Dr. Donne Hazel and she is here today to discuss adjuvant treatment plan.  She appears to be healing and recovering very well from the surgery.  I reviewed her records extensively and collaborated the history with the patient.  SUMMARY OF ONCOLOGIC HISTORY: Oncology History  Ductal carcinoma in situ (DCIS) of right breast  06/14/2022 Initial Diagnosis   Screening mammogram detected right breast abnormality on biopsy came back as DCIS with calcifications and necrosis ER 90%, PR 5%   07/05/2022 Surgery   Right lumpectomy: 5 mm high-grade DCIS with clear margins ER 90%, PR 5%      MEDICAL HISTORY:  Past Medical History:  Diagnosis Date   Anemia    Anxiety    xanax   Arthritis    Bicornate uterus    Breast cancer ([place]) 06/2022   right breast DCIS   Family history of breast cancer    Generalized headaches    imitrex/tylenol    Headache    Hearing loss    bilateral - has hearing aids   History of melanoma    Insomnia    Melanoma ([place])    back of left leg   Skin cancer    Melanoma at 41    SURGICAL HISTORY: Past Surgical History:  Procedure Laterality Date   BREAST LUMPECTOMY WITH RADIOACTIVE SEED LOCALIZATION Right 07/05/2022   Procedure: RIGHT BREAST SEED GUIDED LUMPECTOMY;  Surgeon: Rolm Bookbinder, MD;  Location: [facility];  Service: General;  Laterality: Right;   [place]  07/23/2013   Procedure: CYSTOSCOPY;  Surgeon: Reece Packer, MD;  Location: [facility];  Service: Urology;;   DILATION AND CURETTAGE OF UTERUS     MAB x 3   LAPAROSCOPIC TOTAL HYSTERECTOMY  12/14   at [facility]  07/23/2013   Procedure: LAPAROSCOPY DIAGNOSTIC;  Surgeon: Lyman Speller, MD;  Location: [facility];  Service: Gynecology;;   RADIOFREQUENCY ABLATION     Neck   SKIN BIOPSY      SOCIAL HISTORY: Social History   Socioeconomic History   Marital status: Married    Spouse name: Not on file   Number of children: 2   Years of education: Not on file   Highest education level: Not on file  Occupational History   Not on file  Tobacco Use   Smoking status: Never   Smokeless tobacco: Never  Vaping Use   Vaping Use: Never used  Substance and Sexual Activity   Alcohol use: Yes    Alcohol/week: 0.0 standard drinks of alcohol    Comment: socially   Drug use: Never   Sexual activity: Yes    Partners: Male    Birth control/protection: None, Surgical    Comment: husband vasectomy, hysterectomy  Other Topics Concern   Not on file  Social History Narrative   Lives at home with her husband   Social Determinants of Health   Financial Resource Strain: Not on file  Food Insecurity: No Food Insecurity (  07/04/2022)   Hunger Vital Sign    Worried About Running Out of Food in the Last Year: Never true    [place] in the Last Year: Never true  Transportation Needs: No Transportation Needs (07/04/2022)   PRAPARE - Hydrologist (Medical): No    Lack of Transportation (Non-Medical): No  Physical Activity: Not on file  Stress: Stress Concern Present (07/04/2022)   [place]    Feeling of Stress : Rather much  Social Connections: Not on file  Intimate Partner Violence: Not on file    FAMILY HISTORY: Family History  Problem Relation Age of Onset   Breast cancer Mother 25       Recurrence   Other Mother        memory issue,  had one seizure   Osteoporosis Mother    Alzheimer's disease Mother    Liver cancer Mother    Cancer Father        Prostate   High blood pressure Father    Other Father        neuropathy   GER disease Father        "vocal cord numbing"   Cancer Paternal Emogene Morgan cell-skin   Stroke Paternal Grandfather    Breast cancer Other 81       maternal cousin-bilateral mastectomy-fast growing   Bladder Cancer Maternal Grandmother        d. 27s   Lymphoma Paternal Aunt     ALLERGIES:  is allergic to cephalexin.  MEDICATIONS:  Current Outpatient Medications  Medication Sig Dispense Refill   acetaminophen (TYLENOL) 500 MG tablet Take 500 mg by mouth every 6 (six) hours as needed.     aspirin-acetaminophen-caffeine (EXCEDRIN MIGRAINE) 250-250-65 MG per tablet Take 2 tablets by mouth as needed.      Calcium-Phosphorus-Vitamin D (CITRACAL +D3 PO) Take 1 each by mouth daily.     ibuprofen (ADVIL,MOTRIN) 200 MG tablet Take 600 mg by mouth as needed.     Multiple Vitamins-Minerals (ONE-A-DAY WOMENS PO) Take by mouth.     SUMAtriptan (IMITREX) 100 MG tablet TAKE ONE TABLET AT ONSET OF HEADACHE. MAY REPEAT IN TWO HOURS IF NEEDED. 27 tablet 4   No current facility-administered medications for this visit.    REVIEW OF SYSTEMS:   Constitutional: Denies fevers, chills or abnormal night sweats   All other systems were reviewed with the patient and are negative.  PHYSICAL EXAMINATION: ECOG PERFORMANCE STATUS: 0 - Asymptomatic  Vitals:   07/30/22 1529  BP: 107/65  Pulse: 88  Resp: 18  Temp: 97.9 F (36.6 C)  SpO2: 95%   Filed Weights    GENERAL:alert, no distress and comfortable    LABORATORY DATA:  I have reviewed the data as listed Lab Results  Component Value Date   WBC 4.9 07/16/2013   HGB 13.3 07/16/2013   HCT 39.1 07/16/2013   MCV 86.5 07/16/2013   PLT 215 07/16/2013   Lab Results  Component Value Date   NA 143 07/28/2019   K 5.1 07/28/2019   CL 105  07/28/2019   CO2 22 07/28/2019    RADIOGRAPHIC STUDIES: I have personally reviewed the radiological reports and agreed with the findings in the report.  ASSESSMENT AND PLAN:  Ductal carcinoma in situ (DCIS) of right breast 07/05/2022:Right lumpectomy: 5 mm high-grade DCIS with clear margins ER 90%, PR 5%  Pathology  review: I discussed with the patient the difference between DCIS and invasive breast cancer. It is considered a precancerous lesion. DCIS is classified as a 0. It is generally detected through mammograms as calcifications. We discussed the significance of grades and its impact on prognosis. We also discussed the importance of ER and PR receptors and their implications to adjuvant treatment options. Prognosis of DCIS dependence on grade, comedo necrosis. It is anticipated that if not treated, 20-30% of DCIS can develop into invasive breast cancer.  Recommendation: 1. Adjuvant radiation therapy 2. Followed by antiestrogen therapy with tamoxifen versus anastrozole 5 years  After much discussion patient is leaning towards to using anastrozole instead.  Return to clinic after radiation to start antiestrogen therapy  All questions were answered. The patient knows to call the clinic with any problems, questions or concerns.    Harriette Ohara, MD 07/30/22

## 2022-07-30 NOTE — Progress Notes (Signed)
Location of Breast Cancer:  Ductal carcinoma in situ (DCIS) of right breast  Histology per Pathology Report:  07/05/2022 FINAL MICROSCOPIC DIAGNOSIS:  A. BREAST, RIGHT, LUMPECTOMY:  - High-grade ductal carcinoma in-situ with clear margins resection.  - Please see the synoptic report after specimen C.  B. BREAST, RIGHT ADDITIONAL MEDIAL MARGIN, EXCISION:  - Focal fibrocystic changes without epithelial hyperplasia.  - DCIS and invasive carcinoma are not identified in the specimen.  C. BREAST, RIGHT ADDITIONAL SUPERIOR MARGIN, EXCISION:  - Normal fibroadipose tissue.  - Mammary ducts and lobules are not identified.  - DCIS and invasive carcinoma are not identified in this specimen.   Receptor Status: ER(90%), PR (5%)  Past/Anticipated interventions by surgeon, if any:  07/22/2022 Dr. Rolm Bookbinder (office visit) --Assessment and Plan:  She is doing very well overall.  I told her she could be released to full activity.  She does not need to wear the postoperative bra anymore.  She has appointments with both medical and radiation oncology.  We discussed that I would see her back annually at this point.  We did discuss the role of radiotherapy as well as antiestrogen therapy in preventing further disease. Also discussed with her imaging follow-up.  I think she only needs annual mammography and I would not pursue annual MRI moving forward for her. --Return in about 1 year (around 07/23/2023).  07/05/2022 --Dr. Rolm Bookbinder Right breast radioactive seed guided lumpectomy  Past/Anticipated interventions by medical oncology, if any:  Under care of Dr. Nicholas Lose 07/30/2022 --Recommendation: Adjuvant radiation therapy Followed by antiestrogen therapy with tamoxifen versus anastrozole 5 years --After much discussion patient is leaning towards to using anastrozole instead. --Return to clinic after radiation to start antiestrogen therapy    Lymphedema issues, if any:  Denies     Pain issues, if any:  Reports discomfort is minimal and much less bothersome than after her biopsy  SAFETY ISSUES: Prior radiation? No Pacemaker/ICD? No Possible current pregnancy? No--hysterectomy Is the patient on methotrexate? No  Current Complaints / other details:  Nothing else of note

## 2022-07-31 ENCOUNTER — Ambulatory Visit
Admission: RE | Admit: 2022-07-31 | Discharge: 2022-07-31 | Disposition: A | Discharge status: Home / Self Care | Payer: PRIVATE HEALTH INSURANCE | Source: Ambulatory Visit | Attending: Radiation Oncology | Admitting: Radiation Oncology

## 2022-07-31 ENCOUNTER — Encounter: Payer: Self-pay | Admitting: *Deleted

## 2022-07-31 ENCOUNTER — Encounter: Payer: Self-pay | Admitting: Radiation Oncology

## 2022-07-31 VITALS — BP 107/52 | HR 80 | Temp 97.0°F | Resp 18 | Ht 68.0 in | Wt 168.1 lb

## 2022-07-31 DIAGNOSIS — D0511 Intraductal carcinoma in situ of right breast: Secondary | ICD-10-CM | POA: Diagnosis not present

## 2022-07-31 DIAGNOSIS — Z510 Encounter for antineoplastic radiation therapy: Secondary | ICD-10-CM | POA: Insufficient documentation

## 2022-08-05 ENCOUNTER — Telehealth: Payer: Self-pay | Admitting: Licensed Clinical Social Worker

## 2022-08-05 NOTE — Telephone Encounter (Signed)
[facility] Clinical Social Work  Clinical Social Work was referred by new patient protocol for assessment of psychosocial needs.  Clinical Social Worker attempted to contact patient by phone  to offer support and assess for needs.   No answer. Left VM with direct contact information.      Joeanne Robicheaux E Geniya Fulgham, LCSW  Clinical Social Worker [facility]        

## 2022-08-06 DIAGNOSIS — Z510 Encounter for antineoplastic radiation therapy: Secondary | ICD-10-CM | POA: Diagnosis not present

## 2022-08-07 ENCOUNTER — Other Ambulatory Visit: Payer: Self-pay

## 2022-08-07 ENCOUNTER — Ambulatory Visit
Admission: RE | Admit: 2022-08-07 | Discharge: 2022-08-07 | Disposition: A | Discharge status: Home / Self Care | Payer: PRIVATE HEALTH INSURANCE | Source: Ambulatory Visit | Attending: Radiation Oncology | Admitting: Radiation Oncology

## 2022-08-07 DIAGNOSIS — Z510 Encounter for antineoplastic radiation therapy: Secondary | ICD-10-CM | POA: Diagnosis not present

## 2022-08-07 LAB — RAD ONC ARIA SESSION SUMMARY
Course Elapsed Days: 0
Plan Fractions Treated to Date: 1
Plan Prescribed Dose Per Fraction: 2.67 Gy
Plan Total Fractions Prescribed: 15
Plan Total Prescribed Dose: 40.05 Gy
Reference Point Dosage Given to Date: 2.67 Gy
Reference Point Session Dosage Given: 2.67 Gy
Session Number: 1

## 2022-08-08 ENCOUNTER — Ambulatory Visit
Admission: RE | Admit: 2022-08-08 | Discharge: 2022-08-08 | Disposition: A | Discharge status: Home / Self Care | Payer: PRIVATE HEALTH INSURANCE | Source: Ambulatory Visit | Attending: Radiation Oncology | Admitting: Radiation Oncology

## 2022-08-08 ENCOUNTER — Other Ambulatory Visit: Payer: Self-pay

## 2022-08-08 DIAGNOSIS — Z510 Encounter for antineoplastic radiation therapy: Secondary | ICD-10-CM | POA: Diagnosis not present

## 2022-08-08 LAB — RAD ONC ARIA SESSION SUMMARY
Course Elapsed Days: 1
Plan Fractions Treated to Date: 2
Plan Prescribed Dose Per Fraction: 2.67 Gy
Plan Total Fractions Prescribed: 15
Plan Total Prescribed Dose: 40.05 Gy
Reference Point Dosage Given to Date: 5.34 Gy
Reference Point Session Dosage Given: 2.67 Gy
Session Number: 2

## 2022-08-09 ENCOUNTER — Other Ambulatory Visit: Payer: Self-pay

## 2022-08-09 ENCOUNTER — Ambulatory Visit
Admission: RE | Admit: 2022-08-09 | Discharge: 2022-08-09 | Disposition: A | Discharge status: Home / Self Care | Payer: PRIVATE HEALTH INSURANCE | Source: Ambulatory Visit | Attending: Radiation Oncology | Admitting: Radiation Oncology

## 2022-08-09 DIAGNOSIS — Z510 Encounter for antineoplastic radiation therapy: Secondary | ICD-10-CM | POA: Diagnosis not present

## 2022-08-09 LAB — RAD ONC ARIA SESSION SUMMARY
Course Elapsed Days: 2
Plan Fractions Treated to Date: 3
Plan Prescribed Dose Per Fraction: 2.67 Gy
Plan Total Fractions Prescribed: 15
Plan Total Prescribed Dose: 40.05 Gy
Reference Point Dosage Given to Date: 8.01 Gy
Reference Point Session Dosage Given: 2.67 Gy
Session Number: 3

## 2022-08-12 ENCOUNTER — Other Ambulatory Visit: Payer: Self-pay

## 2022-08-12 ENCOUNTER — Ambulatory Visit: Payer: PRIVATE HEALTH INSURANCE

## 2022-08-12 ENCOUNTER — Ambulatory Visit
Admission: RE | Admit: 2022-08-12 | Discharge: 2022-08-12 | Disposition: A | Discharge status: Home / Self Care | Payer: PRIVATE HEALTH INSURANCE | Source: Ambulatory Visit | Attending: Radiation Oncology | Admitting: Radiation Oncology

## 2022-08-12 DIAGNOSIS — Z510 Encounter for antineoplastic radiation therapy: Secondary | ICD-10-CM | POA: Diagnosis not present

## 2022-08-12 LAB — RAD ONC ARIA SESSION SUMMARY
Course Elapsed Days: 5
Plan Fractions Treated to Date: 4
Plan Prescribed Dose Per Fraction: 2.67 Gy
Plan Total Fractions Prescribed: 15
Plan Total Prescribed Dose: 40.05 Gy
Reference Point Dosage Given to Date: 10.68 Gy
Reference Point Session Dosage Given: 2.67 Gy
Session Number: 4

## 2022-08-12 MED ORDER — RADIAPLEXRX EX GEL: Freq: Two times a day (BID) | CUTANEOUS | Status: DC

## 2022-08-12 MED ORDER — ALRA NON-METALLIC DEODORANT (RAD-ONC)
1.0000 | Freq: Once | TOPICAL | Status: AC
  Administered 2022-08-12: 1 via TOPICAL

## 2022-08-12 NOTE — Progress Notes (Signed)
Pt here for patient teaching.  Pt given Radiation and You booklet, skin care instructions, Alra deodorant, and Radiaplex gel.  Reviewed areas of pertinence such as fatigue, skin changes, breast tenderness, and breast swelling . Pt able to give teach back of to pat skin and use unscented/gentle soap,apply Radiaplex bid, avoid applying anything to skin within 4 hours of treatment, avoid wearing an under wire bra, and to use an electric razor if they must shave. Pt verbalizes understanding of information given and will contact nursing with any questions or concerns.     [email or web address]

## 2022-08-13 ENCOUNTER — Ambulatory Visit
Admission: RE | Admit: 2022-08-13 | Discharge: 2022-08-13 | Disposition: A | Discharge status: Home / Self Care | Payer: PRIVATE HEALTH INSURANCE | Source: Ambulatory Visit | Attending: Radiation Oncology | Admitting: Radiation Oncology

## 2022-08-13 ENCOUNTER — Other Ambulatory Visit: Payer: Self-pay

## 2022-08-13 DIAGNOSIS — Z510 Encounter for antineoplastic radiation therapy: Secondary | ICD-10-CM | POA: Diagnosis not present

## 2022-08-13 LAB — RAD ONC ARIA SESSION SUMMARY
Course Elapsed Days: 6
Plan Fractions Treated to Date: 5
Plan Prescribed Dose Per Fraction: 2.67 Gy
Plan Total Fractions Prescribed: 15
Plan Total Prescribed Dose: 40.05 Gy
Reference Point Dosage Given to Date: 13.35 Gy
Reference Point Session Dosage Given: 2.67 Gy
Session Number: 5

## 2022-08-14 ENCOUNTER — Ambulatory Visit
Admission: RE | Admit: 2022-08-14 | Discharge: 2022-08-14 | Disposition: A | Discharge status: Home / Self Care | Payer: PRIVATE HEALTH INSURANCE | Source: Ambulatory Visit | Attending: Radiation Oncology | Admitting: Radiation Oncology

## 2022-08-14 ENCOUNTER — Other Ambulatory Visit: Payer: Self-pay

## 2022-08-14 DIAGNOSIS — Z510 Encounter for antineoplastic radiation therapy: Secondary | ICD-10-CM | POA: Diagnosis not present

## 2022-08-14 LAB — RAD ONC ARIA SESSION SUMMARY
Course Elapsed Days: 7
Plan Fractions Treated to Date: 6
Plan Prescribed Dose Per Fraction: 2.67 Gy
Plan Total Fractions Prescribed: 15
Plan Total Prescribed Dose: 40.05 Gy
Reference Point Dosage Given to Date: 16.02 Gy
Reference Point Session Dosage Given: 2.67 Gy
Session Number: 6

## 2022-08-15 ENCOUNTER — Ambulatory Visit
Admission: RE | Admit: 2022-08-15 | Discharge: 2022-08-15 | Disposition: A | Discharge status: Home / Self Care | Payer: PRIVATE HEALTH INSURANCE | Source: Ambulatory Visit | Attending: Radiation Oncology | Admitting: Radiation Oncology

## 2022-08-15 DIAGNOSIS — Z510 Encounter for antineoplastic radiation therapy: Secondary | ICD-10-CM | POA: Diagnosis not present

## 2022-08-16 ENCOUNTER — Ambulatory Visit: Payer: PRIVATE HEALTH INSURANCE

## 2022-08-16 ENCOUNTER — Other Ambulatory Visit: Payer: Self-pay

## 2022-08-16 ENCOUNTER — Ambulatory Visit
Admission: RE | Admit: 2022-08-16 | Discharge: 2022-08-16 | Disposition: A | Discharge status: Home / Self Care | Payer: PRIVATE HEALTH INSURANCE | Source: Ambulatory Visit | Attending: Radiation Oncology | Admitting: Radiation Oncology

## 2022-08-16 DIAGNOSIS — Z510 Encounter for antineoplastic radiation therapy: Secondary | ICD-10-CM | POA: Diagnosis present

## 2022-08-16 DIAGNOSIS — D0511 Intraductal carcinoma in situ of right breast: Secondary | ICD-10-CM | POA: Insufficient documentation

## 2022-08-16 DIAGNOSIS — Z79811 Long term (current) use of aromatase inhibitors: Secondary | ICD-10-CM | POA: Diagnosis not present

## 2022-08-16 LAB — RAD ONC ARIA SESSION SUMMARY
Course Elapsed Days: 9
Plan Fractions Treated to Date: 8
Plan Prescribed Dose Per Fraction: 2.67 Gy
Plan Total Fractions Prescribed: 15
Plan Total Prescribed Dose: 40.05 Gy
Reference Point Dosage Given to Date: 21.36 Gy
Reference Point Session Dosage Given: 4.005 Gy
Session Number: 7

## 2022-08-20 ENCOUNTER — Ambulatory Visit: Payer: PRIVATE HEALTH INSURANCE

## 2022-08-20 ENCOUNTER — Ambulatory Visit
Admission: RE | Admit: 2022-08-20 | Discharge: 2022-08-20 | Disposition: A | Discharge status: Home / Self Care | Payer: PRIVATE HEALTH INSURANCE | Source: Ambulatory Visit | Attending: Radiation Oncology | Admitting: Radiation Oncology

## 2022-08-20 ENCOUNTER — Other Ambulatory Visit: Payer: Self-pay

## 2022-08-20 DIAGNOSIS — Z510 Encounter for antineoplastic radiation therapy: Secondary | ICD-10-CM | POA: Diagnosis not present

## 2022-08-20 LAB — RAD ONC ARIA SESSION SUMMARY
Course Elapsed Days: 13
Plan Fractions Treated to Date: 9
Plan Prescribed Dose Per Fraction: 2.67 Gy
Plan Total Fractions Prescribed: 15
Plan Total Prescribed Dose: 40.05 Gy
Reference Point Dosage Given to Date: 24.03 Gy
Reference Point Session Dosage Given: 2.67 Gy
Session Number: 8

## 2022-08-21 ENCOUNTER — Ambulatory Visit
Admission: RE | Admit: 2022-08-21 | Discharge: 2022-08-21 | Disposition: A | Discharge status: Home / Self Care | Payer: PRIVATE HEALTH INSURANCE | Source: Ambulatory Visit | Attending: Radiation Oncology | Admitting: Radiation Oncology

## 2022-08-21 ENCOUNTER — Other Ambulatory Visit: Payer: Self-pay

## 2022-08-21 DIAGNOSIS — Z510 Encounter for antineoplastic radiation therapy: Secondary | ICD-10-CM | POA: Diagnosis not present

## 2022-08-21 LAB — RAD ONC ARIA SESSION SUMMARY
Course Elapsed Days: 14
Plan Fractions Treated to Date: 10
Plan Prescribed Dose Per Fraction: 2.67 Gy
Plan Total Fractions Prescribed: 15
Plan Total Prescribed Dose: 40.05 Gy
Reference Point Dosage Given to Date: 26.7 Gy
Reference Point Session Dosage Given: 2.67 Gy
Session Number: 9

## 2022-08-22 ENCOUNTER — Other Ambulatory Visit: Payer: Self-pay

## 2022-08-22 ENCOUNTER — Ambulatory Visit
Admission: RE | Admit: 2022-08-22 | Discharge: 2022-08-22 | Disposition: A | Discharge status: Home / Self Care | Payer: PRIVATE HEALTH INSURANCE | Source: Ambulatory Visit | Attending: Radiation Oncology | Admitting: Radiation Oncology

## 2022-08-22 DIAGNOSIS — Z510 Encounter for antineoplastic radiation therapy: Secondary | ICD-10-CM | POA: Diagnosis not present

## 2022-08-22 LAB — RAD ONC ARIA SESSION SUMMARY
Course Elapsed Days: 15
Plan Fractions Treated to Date: 11
Plan Prescribed Dose Per Fraction: 2.67 Gy
Plan Total Fractions Prescribed: 15
Plan Total Prescribed Dose: 40.05 Gy
Reference Point Dosage Given to Date: 29.37 Gy
Reference Point Session Dosage Given: 2.67 Gy
Session Number: 10

## 2022-08-23 ENCOUNTER — Ambulatory Visit
Admission: RE | Admit: 2022-08-23 | Discharge: 2022-08-23 | Disposition: A | Discharge status: Home / Self Care | Payer: PRIVATE HEALTH INSURANCE | Source: Ambulatory Visit | Attending: Radiation Oncology | Admitting: Radiation Oncology

## 2022-08-23 ENCOUNTER — Other Ambulatory Visit: Payer: Self-pay

## 2022-08-23 DIAGNOSIS — Z510 Encounter for antineoplastic radiation therapy: Secondary | ICD-10-CM | POA: Diagnosis not present

## 2022-08-23 LAB — RAD ONC ARIA SESSION SUMMARY
Course Elapsed Days: 16
Plan Fractions Treated to Date: 12
Plan Prescribed Dose Per Fraction: 2.67 Gy
Plan Total Fractions Prescribed: 15
Plan Total Prescribed Dose: 40.05 Gy
Reference Point Dosage Given to Date: 32.04 Gy
Reference Point Session Dosage Given: 2.67 Gy
Session Number: 11

## 2022-08-26 ENCOUNTER — Ambulatory Visit
Admission: RE | Admit: 2022-08-26 | Discharge: 2022-08-26 | Disposition: A | Discharge status: Home / Self Care | Payer: PRIVATE HEALTH INSURANCE | Source: Ambulatory Visit | Attending: Radiation Oncology | Admitting: Radiation Oncology

## 2022-08-26 ENCOUNTER — Other Ambulatory Visit: Payer: Self-pay

## 2022-08-26 ENCOUNTER — Ambulatory Visit: Payer: PRIVATE HEALTH INSURANCE

## 2022-08-26 ENCOUNTER — Ambulatory Visit: Payer: PRIVATE HEALTH INSURANCE | Admitting: Radiation Oncology

## 2022-08-26 DIAGNOSIS — Z510 Encounter for antineoplastic radiation therapy: Secondary | ICD-10-CM | POA: Diagnosis not present

## 2022-08-26 LAB — RAD ONC ARIA SESSION SUMMARY
Course Elapsed Days: 19
Plan Fractions Treated to Date: 13
Plan Prescribed Dose Per Fraction: 2.67 Gy
Plan Total Fractions Prescribed: 15
Plan Total Prescribed Dose: 40.05 Gy
Reference Point Dosage Given to Date: 34.71 Gy
Reference Point Session Dosage Given: 2.67 Gy
Session Number: 12

## 2022-08-27 ENCOUNTER — Ambulatory Visit
Admission: RE | Admit: 2022-08-27 | Discharge: 2022-08-27 | Disposition: A | Discharge status: Home / Self Care | Payer: PRIVATE HEALTH INSURANCE | Source: Ambulatory Visit | Attending: Radiation Oncology | Admitting: Radiation Oncology

## 2022-08-27 ENCOUNTER — Other Ambulatory Visit: Payer: Self-pay

## 2022-08-27 DIAGNOSIS — Z510 Encounter for antineoplastic radiation therapy: Secondary | ICD-10-CM | POA: Diagnosis not present

## 2022-08-27 LAB — RAD ONC ARIA SESSION SUMMARY
Course Elapsed Days: 20
Plan Fractions Treated to Date: 14
Plan Prescribed Dose Per Fraction: 2.67 Gy
Plan Total Fractions Prescribed: 15
Plan Total Prescribed Dose: 40.05 Gy
Reference Point Dosage Given to Date: 37.38 Gy
Reference Point Session Dosage Given: 2.67 Gy
Session Number: 13

## 2022-08-28 ENCOUNTER — Ambulatory Visit
Admission: RE | Admit: 2022-08-28 | Discharge: 2022-08-28 | Disposition: A | Discharge status: Home / Self Care | Payer: PRIVATE HEALTH INSURANCE | Source: Ambulatory Visit | Attending: Radiation Oncology | Admitting: Radiation Oncology

## 2022-08-28 ENCOUNTER — Other Ambulatory Visit: Payer: Self-pay

## 2022-08-28 ENCOUNTER — Ambulatory Visit: Payer: PRIVATE HEALTH INSURANCE

## 2022-08-28 DIAGNOSIS — Z510 Encounter for antineoplastic radiation therapy: Secondary | ICD-10-CM | POA: Diagnosis not present

## 2022-08-28 LAB — RAD ONC ARIA SESSION SUMMARY
Course Elapsed Days: 21
Plan Fractions Treated to Date: 15
Plan Prescribed Dose Per Fraction: 2.67 Gy
Plan Total Fractions Prescribed: 15
Plan Total Prescribed Dose: 40.05 Gy
Reference Point Dosage Given to Date: 40.05 Gy
Reference Point Session Dosage Given: 2.67 Gy
Session Number: 14

## 2022-08-29 ENCOUNTER — Ambulatory Visit
Admission: RE | Admit: 2022-08-29 | Discharge: 2022-08-29 | Disposition: A | Discharge status: Home / Self Care | Payer: PRIVATE HEALTH INSURANCE | Source: Ambulatory Visit | Attending: Radiation Oncology | Admitting: Radiation Oncology

## 2022-08-29 ENCOUNTER — Ambulatory Visit: Payer: PRIVATE HEALTH INSURANCE

## 2022-08-29 ENCOUNTER — Other Ambulatory Visit: Payer: Self-pay

## 2022-08-29 DIAGNOSIS — Z510 Encounter for antineoplastic radiation therapy: Secondary | ICD-10-CM | POA: Diagnosis not present

## 2022-08-29 LAB — RAD ONC ARIA SESSION SUMMARY
Course Elapsed Days: 22
Plan Fractions Treated to Date: 1
Plan Prescribed Dose Per Fraction: 2 Gy
Plan Total Fractions Prescribed: 5
Plan Total Prescribed Dose: 10 Gy
Reference Point Dosage Given to Date: 2 Gy
Reference Point Session Dosage Given: 2 Gy
Session Number: 15

## 2022-08-30 ENCOUNTER — Ambulatory Visit
Admission: RE | Admit: 2022-08-30 | Discharge: 2022-08-30 | Disposition: A | Discharge status: Home / Self Care | Payer: PRIVATE HEALTH INSURANCE | Source: Ambulatory Visit | Attending: Radiation Oncology | Admitting: Radiation Oncology

## 2022-08-30 ENCOUNTER — Ambulatory Visit: Payer: PRIVATE HEALTH INSURANCE

## 2022-08-30 ENCOUNTER — Other Ambulatory Visit: Payer: Self-pay

## 2022-08-30 DIAGNOSIS — Z510 Encounter for antineoplastic radiation therapy: Secondary | ICD-10-CM | POA: Diagnosis not present

## 2022-08-30 LAB — RAD ONC ARIA SESSION SUMMARY
Course Elapsed Days: 23
Plan Fractions Treated to Date: 2
Plan Prescribed Dose Per Fraction: 2 Gy
Plan Total Fractions Prescribed: 5
Plan Total Prescribed Dose: 10 Gy
Reference Point Dosage Given to Date: 4 Gy
Reference Point Session Dosage Given: 2 Gy
Session Number: 16

## 2022-09-02 ENCOUNTER — Ambulatory Visit: Payer: PRIVATE HEALTH INSURANCE

## 2022-09-02 ENCOUNTER — Ambulatory Visit
Admission: RE | Admit: 2022-09-02 | Discharge: 2022-09-02 | Disposition: A | Discharge status: Home / Self Care | Payer: PRIVATE HEALTH INSURANCE | Source: Ambulatory Visit | Attending: Radiation Oncology | Admitting: Radiation Oncology

## 2022-09-02 ENCOUNTER — Other Ambulatory Visit: Payer: Self-pay

## 2022-09-02 DIAGNOSIS — D0511 Intraductal carcinoma in situ of right breast: Secondary | ICD-10-CM

## 2022-09-02 DIAGNOSIS — Z510 Encounter for antineoplastic radiation therapy: Secondary | ICD-10-CM | POA: Diagnosis not present

## 2022-09-02 LAB — RAD ONC ARIA SESSION SUMMARY
Course Elapsed Days: 26
Plan Fractions Treated to Date: 3
Plan Prescribed Dose Per Fraction: 2 Gy
Plan Total Fractions Prescribed: 5
Plan Total Prescribed Dose: 10 Gy
Reference Point Dosage Given to Date: 6 Gy
Reference Point Session Dosage Given: 2 Gy
Session Number: 17

## 2022-09-02 MED ORDER — RADIAPLEXRX EX GEL: Freq: Once | CUTANEOUS | Status: AC

## 2022-09-03 ENCOUNTER — Ambulatory Visit: Payer: PRIVATE HEALTH INSURANCE

## 2022-09-03 ENCOUNTER — Other Ambulatory Visit: Payer: Self-pay

## 2022-09-03 ENCOUNTER — Ambulatory Visit
Admission: RE | Admit: 2022-09-03 | Discharge: 2022-09-03 | Disposition: A | Discharge status: Home / Self Care | Payer: PRIVATE HEALTH INSURANCE | Source: Ambulatory Visit | Attending: Radiation Oncology | Admitting: Radiation Oncology

## 2022-09-03 DIAGNOSIS — Z510 Encounter for antineoplastic radiation therapy: Secondary | ICD-10-CM | POA: Diagnosis not present

## 2022-09-03 LAB — RAD ONC ARIA SESSION SUMMARY
Course Elapsed Days: 27
Plan Fractions Treated to Date: 4
Plan Prescribed Dose Per Fraction: 2 Gy
Plan Total Fractions Prescribed: 5
Plan Total Prescribed Dose: 10 Gy
Reference Point Dosage Given to Date: 8 Gy
Reference Point Session Dosage Given: 2 Gy
Session Number: 18

## 2022-09-03 NOTE — Progress Notes (Signed)
   Patient Care Team: Harlan Stains, MD as PCP - General (Family Medicine) Rockwell Germany, RN as Oncology Nurse Navigator Mauro Kaufmann, RN as Oncology Nurse Navigator Rolm Bookbinder, MD as Consulting Physician (General Surgery) Nicholas Lose, MD as Consulting Physician (Hematology and Oncology) Eppie Gibson, MD as Attending Physician (Radiation Oncology)  DIAGNOSIS: No diagnosis found.  SUMMARY OF ONCOLOGIC HISTORY: Oncology History  Ductal carcinoma in situ (DCIS) of right breast  06/14/2022 Initial Diagnosis   Screening mammogram detected right breast abnormality on biopsy came back as DCIS with calcifications and necrosis ER 90%, PR 5%   07/05/2022 Surgery   Right lumpectomy: 5 mm high-grade DCIS with clear margins ER 90%, PR 5%     CHIEF COMPLIANT: Follow-up after radiation  INTERVAL HISTORY: Suzanne Hicks is a 61 y.o. female is here because of recent diagnosis of right breast DCIS.  She had a screening mammogram detected abnormality in the right breast. She presents to the clinic today for a follow-up after radiation.   ALLERGIES:  is allergic to cephalexin.  MEDICATIONS:  Current Outpatient Medications  Medication Sig Dispense Refill   acetaminophen (TYLENOL) 500 MG tablet Take 500 mg by mouth every 6 (six) hours as needed.     aspirin-acetaminophen-caffeine (EXCEDRIN MIGRAINE) 250-250-65 MG per tablet Take 2 tablets by mouth as needed.      Calcium-Phosphorus-Vitamin D (CITRACAL +D3 PO) Take 1 each by mouth daily.     ibuprofen (ADVIL,MOTRIN) 200 MG tablet Take 600 mg by mouth as needed.     Multiple Vitamins-Minerals (ONE-A-DAY WOMENS PO) Take by mouth.     SUMAtriptan (IMITREX) 100 MG tablet TAKE ONE TABLET AT ONSET OF HEADACHE. MAY REPEAT IN TWO HOURS IF NEEDED. 27 tablet 4   No current facility-administered medications for this visit.    PHYSICAL EXAMINATION: ECOG PERFORMANCE STATUS: {CHL ONC ECOG PS:[phone]}  There were no vitals filed for this  visit. There were no vitals filed for this visit.  BREAST:*** No palpable masses or nodules in either right or left breasts. No palpable axillary supraclavicular or infraclavicular adenopathy no breast tenderness or nipple discharge. (exam performed in the presence of a chaperone)  LABORATORY DATA:  I have reviewed the data as listed    Latest Ref Rng & Units 07/28/2019    8:06 AM  CMP  Glucose 65 - 99 mg/dL 74   BUN 8 - 27 mg/dL 19   Creatinine 0.57 - 1.00 mg/dL 0.59   Sodium 134 - 144 mmol/L 143   Potassium 3.5 - 5.2 mmol/L 5.1   Chloride 96 - 106 mmol/L 105   CO2 20 - 29 mmol/L 22   Calcium 8.7 - 10.3 mg/dL 8.9     Lab Results  Component Value Date   WBC 4.9 07/16/2013   HGB 13.3 07/16/2013   HCT 39.1 07/16/2013   MCV 86.5 07/16/2013   PLT 215 07/16/2013    ASSESSMENT & PLAN:  No problem-specific Assessment & Plan notes found for this encounter.    No orders of the defined types were placed in this encounter.  The patient has a good understanding of the overall plan. she agrees with it. she will call with any problems that may develop before the next visit here. Total time spent: 30 mins including face to face time and time spent for planning, charting and co-ordination of care   Suzzette Righter, Zeb 09/03/22    I Gardiner Coins am scribing for Dr. Lindi Adie  ***

## 2022-09-04 ENCOUNTER — Other Ambulatory Visit: Payer: Self-pay | Admitting: *Deleted

## 2022-09-04 ENCOUNTER — Ambulatory Visit
Admission: RE | Admit: 2022-09-04 | Discharge: 2022-09-04 | Disposition: A | Discharge status: Home / Self Care | Payer: PRIVATE HEALTH INSURANCE | Source: Ambulatory Visit | Attending: Radiation Oncology | Admitting: Radiation Oncology

## 2022-09-04 ENCOUNTER — Ambulatory Visit: Payer: PRIVATE HEALTH INSURANCE

## 2022-09-04 ENCOUNTER — Other Ambulatory Visit: Payer: Self-pay

## 2022-09-04 ENCOUNTER — Inpatient Hospital Stay: Payer: PRIVATE HEALTH INSURANCE | Attending: Hematology and Oncology | Admitting: Hematology and Oncology

## 2022-09-04 ENCOUNTER — Encounter: Payer: Self-pay | Admitting: *Deleted

## 2022-09-04 ENCOUNTER — Encounter: Payer: Self-pay | Admitting: Radiation Oncology

## 2022-09-04 DIAGNOSIS — D0511 Intraductal carcinoma in situ of right breast: Secondary | ICD-10-CM

## 2022-09-04 DIAGNOSIS — Z510 Encounter for antineoplastic radiation therapy: Secondary | ICD-10-CM | POA: Diagnosis not present

## 2022-09-04 DIAGNOSIS — Z79811 Long term (current) use of aromatase inhibitors: Secondary | ICD-10-CM | POA: Insufficient documentation

## 2022-09-04 LAB — RAD ONC ARIA SESSION SUMMARY
Course Elapsed Days: 28
Plan Fractions Treated to Date: 5
Plan Prescribed Dose Per Fraction: 2 Gy
Plan Total Fractions Prescribed: 5
Plan Total Prescribed Dose: 10 Gy
Reference Point Dosage Given to Date: 10 Gy
Reference Point Session Dosage Given: 2 Gy
Session Number: 19

## 2022-09-04 MED ORDER — ANASTROZOLE 1 MG PO TABS: 1.0000 mg | ORAL_TABLET | Freq: Every day | ORAL | 3 refills | Status: DC

## 2022-09-04 NOTE — Assessment & Plan Note (Addendum)
07/05/2022:Right lumpectomy: 5 mm high-grade DCIS with clear margins ER 90%, PR 5%  Treatment plan: 1. Adjuvant radiation therapy 08/08/2022-09/04/2022 2. Followed by antiestrogen therapy with tamoxifen versus anastrozole 5 years ---------------------------------------------------------------------------------------------------------------------------------- Tamoxifen counseling: We discussed the risks and benefits of tamoxifen. These include but not limited to insomnia, hot flashes, mood changes, vaginal dryness, and weight gain. Although rare, serious side effects including endometrial cancer, risk of blood clots were also discussed. We strongly believe that the benefits far outweigh the risks. Patient understands these risks and consented to starting treatment. Planned treatment duration is 5 years.  Return to clinic in 3 months for survivorship care plan visit

## 2022-09-09 ENCOUNTER — Encounter (HOSPITAL_BASED_OUTPATIENT_CLINIC_OR_DEPARTMENT_OTHER): Payer: Self-pay | Admitting: Obstetrics & Gynecology

## 2022-09-09 ENCOUNTER — Ambulatory Visit (INDEPENDENT_AMBULATORY_CARE_PROVIDER_SITE_OTHER): Payer: PRIVATE HEALTH INSURANCE | Admitting: Obstetrics & Gynecology

## 2022-09-09 VITALS — BP 113/69 | HR 71 | Ht 68.0 in | Wt 172.2 lb

## 2022-09-09 DIAGNOSIS — N952 Postmenopausal atrophic vaginitis: Secondary | ICD-10-CM

## 2022-09-09 DIAGNOSIS — D0511 Intraductal carcinoma in situ of right breast: Secondary | ICD-10-CM

## 2022-09-09 DIAGNOSIS — G43709 Chronic migraine without aura, not intractable, without status migrainosus: Secondary | ICD-10-CM | POA: Diagnosis not present

## 2022-09-09 DIAGNOSIS — Z85820 Personal history of malignant melanoma of skin: Secondary | ICD-10-CM | POA: Diagnosis not present

## 2022-09-09 DIAGNOSIS — Z01419 Encounter for gynecological examination (general) (routine) without abnormal findings: Secondary | ICD-10-CM | POA: Diagnosis not present

## 2022-09-09 DIAGNOSIS — Z90710 Acquired absence of both cervix and uterus: Secondary | ICD-10-CM

## 2022-09-09 MED ORDER — SUMATRIPTAN SUCCINATE 100 MG PO TABS: ORAL_TABLET | ORAL | 4 refills | Status: DC

## 2022-09-09 NOTE — Progress Notes (Signed)
61 y.o. [id number] Married White or Caucasian female here for annual exam.  Had lumpectomy 98m DCIS, high grade, with negative margins.  ER/PR positive.  Did radiation.  Will start anastrozole for 5 years.    Patient's last menstrual period was 10/31/2013.          Sexually active: Not currently The current method of family planning is status post hysterectomy.    Exercising: not recently due to swimming Smoker:  no  Health Maintenance: Pap:  2017 History of abnormal Pap:  no MMG:  11/17/2020 with abnormal findings.  Biopsy showed DCIS Colonoscopy:  10/2019, follow up 5 years BMD:   11/17/2020 with t score -2.1 Screening Labs: has appt in the fall   reports that she has never smoked. She has never used smokeless tobacco. She reports current alcohol use. She reports that she does not use drugs.  Past Medical History:  Diagnosis Date   Anemia    Anxiety    xanax   Arthritis    Bicornate uterus    Breast cancer ([place] 06[place]   right breast DCIS   Family history of breast cancer    Generalized headaches    imitrex/tylenol    Headache    Hearing loss    bilateral - has hearing aids   History of melanoma    Insomnia    [place] ([place]    back of left leg   Skin cancer    Melanoma at 41    Past Surgical History:  Procedure Laterality Date   BREAST LUMPECTOMY WITH RADIOACTIVE SEED LOCALIZATION Right 07/05/2022   Procedure: RIGHT BREAST SEED GUIDED LUMPECTOMY;  Surgeon: WRolm Bookbinder MD;  [facility]  Service: General;  Laterality: Right;   [place] 07/23/2013   Procedure: CYSTOSCOPY;  Surgeon: SReece Packer MD;  [facility];  Service: Urology;;   DILATION AND CURETTAGE OF UTERUS     MAB x 3   [place]AR[place]   Procedure: LAPAROSCOPY DIAGNOSTIC;  Surgeon: MLyman Speller MD;  [facility];  Service: Gynecology;;    RADIOFREQUENCY ABLATION     Neck   SKIN BIOPSY      Current Outpatient Medications  Medication Sig Dispense Refill   acetaminophen (TYLENOL) 500 MG tablet Take 500 mg by mouth every 6 (six) hours as needed.     aspirin-acetaminophen-caffeine (EXCEDRIN MIGRAINE) 250-250-65 MG per tablet Take 2 tablets by mouth as needed.      Calcium-Phosphorus-Vitamin D (CITRACAL +D3 PO) Take 1 each by mouth daily.     ibuprofen (ADVIL,MOTRIN) 200 MG tablet Take 600 mg by mouth as needed.     Multiple Vitamins-Minerals (ONE-A-DAY WOMENS PO) Take by mouth.     SUMAtriptan (IMITREX) 100 MG tablet TAKE ONE TABLET AT ONSET OF HEADACHE. MAY REPEAT IN TWO HOURS IF NEEDED. 27 tablet 4   [START ON 09/15/2022] anastrozole (ARIMIDEX) 1 MG tablet Take 1 tablet (1 mg total) by mouth daily. (Patient not taking: Reported on 09/09/2022) 90 tablet 3   No current facility-administered medications for this visit.    Family History  Problem Relation Age of Onset   Breast cancer Mother 738      Recurrence   Other Mother        memory issue, had one seizure   Osteoporosis Mother    Alzheimer's disease Mother  Liver cancer Mother    Cancer Father        Prostate   High blood pressure Father    Other Father        neuropathy   GER disease Father        "vocal cord numbing"   Cancer Paternal Grandmother        Dwaine Gale cell-skin   Stroke Paternal Grandfather    Breast cancer Other 11       maternal cousin-bilateral mastectomy-fast growing   Bladder Cancer Maternal Grandmother        d. 4s   Lymphoma Paternal Aunt     ROS: Constitutional: negative Genitourinary:negative  Exam:   BP 113/69   Pulse 71   Ht '5\' 8"'$  (1.727 m)   Wt 172 lb 3.2 oz (78.1 kg)   LMP 10/31/2013   BMI 26.18 kg/m   Height: '5\' 8"'$  (172.7 cm)  General appearance: alert, cooperative and appears stated age Head: Normocephalic, without obvious abnormality, atraumatic Neck: no adenopathy, supple, symmetrical, trachea midline and thyroid  normal to inspection and palpation Lungs: clear to auscultation bilaterally Breasts: well healed scar on right breast, no masses, nipple discharge or LAD on either breast Heart: regular rate and rhythm Abdomen: soft, non-tender; bowel sounds normal; no masses,  no organomegaly Extremities: extremities normal, atraumatic, no cyanosis or edema Skin: Skin color, texture, turgor normal. No rashes or lesions Lymph nodes: Cervical, supraclavicular, and axillary nodes normal. No abnormal inguinal nodes palpated Neurologic: Grossly normal   Pelvic: External genitalia:  no lesions              Urethra:  normal appearing urethra with no masses, tenderness or lesions              Bartholins and Skenes: normal                 Vagina: normal appearing vagina with normal color and no discharge, no lesions              Cervix: absent              Pap taken: No. Bimanual Exam:  Uterus:  uterus absent              Adnexa: no mass, fullness, tenderness               Rectovaginal: Confirms               Anus:  normal sphincter tone, no lesions  Chaperone, Octaviano Batty, CMA, was present for exam.  Assessment/Plan: 1. Well woman exam with routine gynecological exam - Pap smear 2017. H/o hysterectomy. - Mammogram 11/17/2020 ultimately showing DCIS - Colonoscopy 10/2019.  Follow up 5 years - Bone mineral density 11/17/2020.  T score -2.1 - lab work done done with PCP - vaccines reviewed/updated  2. Chronic migraine without aura without status - SUMAtriptan (IMITREX) 100 MG tablet; TAKE ONE TABLET AT ONSET OF HEADACHE. MAY REPEAT IN TWO HOURS IF NEEDED.  Dispense: 27 tablet; Refill: 4  3. Ductal carcinoma in situ (DCIS) of right breast - followed by Dr. Lindi Adie and Dr. Donne Hazel - likely going to start arimidex  4. History of melanoma - sees dermatology  5. History of hysterectomy  6. Vaginal atrophy - has tried several products, OTC and prescription, but does not need any prescriptions for  this

## 2022-09-16 NOTE — Progress Notes (Signed)
                                                                                                                                                             Patient Name: Suzanne Hicks MRN: [id number] DOB: 10/31/1961 Referring Physician: WHITE CYNTHIA (Profile Not Attached) Date of Service: 09/04/2022 [facility] [place]                                                        End Of Treatment Note  Diagnoses: D05.11-Intraductal carcinoma in situ of right breast  Cancer Staging:  S/p lumpectomy: Stage 0 (cTis (DCIS), cN0, cM0) Right Breast, High-grade ductal carcinoma in-situ, ER+ / PR+    Intent: Curative  Radiation Treatment Dates: 08/07/2022 through 09/04/2022 Site Technique Total Dose (Gy) Dose per Fx (Gy) Completed Fx Beam Energies  Breast, Right: Breast_R 3D 40.05/40.05 2.67 15/15 6XFFF  Breast, Right: Breast_R_Bst 3D 10/10 2 5/5 6X, 10X   Narrative: The patient tolerated radiation therapy relatively well.   Plan: The patient will follow-up with radiation oncology in 23mo   -----------------------------------  SEppie Gibson MD

## 2022-10-10 ENCOUNTER — Telehealth: Payer: Self-pay

## 2022-10-10 NOTE — Telephone Encounter (Signed)
Rn called pt to get update on her current condition. She stated things were going well overall but did have some skin questions related to her surgery site and scar. She stated her surgical side was more bumpy and scaly overall. She did deny pain, range of motion, and fatigue concerns. She did have some questions about some skin care recommendations for the doctor as well. Rn Anderson Malta encouraged pt to keep appointment to get questions answered and also to have doctor look at her skin.

## 2022-10-10 NOTE — Progress Notes (Signed)
Patient Care Team: Harlan Stains, MD as PCP - General (Family Medicine) Rockwell Germany, RN as Oncology Nurse Navigator Mauro Kaufmann, RN as Oncology Nurse Navigator Rolm Bookbinder, MD as Consulting Physician (General Surgery) Nicholas Lose, MD as Consulting Physician (Hematology and Oncology) Eppie Gibson, MD as Attending Physician (Radiation Oncology)  DIAGNOSIS:  Encounter Diagnosis  Name Primary?   Ductal carcinoma in situ (DCIS) of right breast     SUMMARY OF ONCOLOGIC HISTORY: Oncology History  Ductal carcinoma in situ (DCIS) of right breast  06/14/2022 Initial Diagnosis   Screening mammogram detected right breast abnormality on biopsy came back as DCIS with calcifications and necrosis ER 90%, PR 5%   07/05/2022 Surgery   Right lumpectomy: 5 mm high-grade DCIS with clear margins ER 90%, PR 5%     CHIEF COMPLIANT: Follow-up assess tolerance to anastrozole  INTERVAL HISTORY: Suzanne Hicks is a  61 y.o. female is here because of recent diagnosis of right breast DCIS. Currently on anastrozole. She presents to the clinic for a follow-up. She reports that she is getting a lot of headaches. She has neck arthritis. She says they come on and off. She can go a week without it and hen it comes back. She complains of not sleeping well and waking up with headaches. She went through dry needling and nothing helped.    ALLERGIES:  is allergic to cephalexin.  MEDICATIONS:  Current Outpatient Medications  Medication Sig Dispense Refill   acetaminophen (TYLENOL) 500 MG tablet Take 500 mg by mouth every 6 (six) hours as needed.     anastrozole (ARIMIDEX) 1 MG tablet Take 1 tablet (1 mg total) by mouth daily. 90 tablet 3   aspirin-acetaminophen-caffeine (EXCEDRIN MIGRAINE) 893-810-17 MG per tablet Take 2 tablets by mouth as needed.      Calcium-Phosphorus-Vitamin D (CITRACAL +D3 PO) Take 1 each by mouth daily.     ibuprofen (ADVIL,MOTRIN) 200 MG tablet Take 600 mg by mouth as  needed.     Multiple Vitamins-Minerals (ONE-A-DAY WOMENS PO) Take by mouth.     SUMAtriptan (IMITREX) 100 MG tablet TAKE ONE TABLET AT ONSET OF HEADACHE. MAY REPEAT IN TWO HOURS IF NEEDED. 27 tablet 4   No current facility-administered medications for this visit.    PHYSICAL EXAMINATION: ECOG PERFORMANCE STATUS: 1 - Symptomatic but completely ambulatory  Vitals:   10/11/22 0941  BP: 102/67  Pulse: 77  Resp: 18  Temp: (!) 97.4 F (36.3 C)  SpO2: 99%   Filed Weights   10/11/22 0941  Weight: 171 lb (77.6 kg)      LABORATORY DATA:  I have reviewed the data as listed    Latest Ref Rng & Units 07/28/2019    8:06 AM  CMP  Glucose 65 - 99 mg/dL 74   BUN 8 - 27 mg/dL 19   Creatinine 0.57 - 1.00 mg/dL 0.59   Sodium 134 - 144 mmol/L 143   Potassium 3.5 - 5.2 mmol/L 5.1   Chloride 96 - 106 mmol/L 105   CO2 20 - 29 mmol/L 22   Calcium 8.7 - 10.3 mg/dL 8.9     Lab Results  Component Value Date   WBC 4.9 07/16/2013   HGB 13.3 07/16/2013   HCT 39.1 07/16/2013   MCV 86.5 07/16/2013   PLT 215 07/16/2013    ASSESSMENT & PLAN:  Ductal carcinoma in situ (DCIS) of right breast 07/05/2022:Right lumpectomy: 5 mm high-grade DCIS with clear margins ER 90%, PR 5%   Treatment plan:  1. Adjuvant radiation therapy 08/08/2022-09/04/2022 2. Followed by antiestrogen therapy with tamoxifen versus anastrozole 5 years started 09/04/2022 ---------------------------------------------------------------------------------------------------------------------------------- Anastrozole  toxicities: 1.  Frequent headaches: She had headaches previous to starting anastrozole and is unclear if it is getting any worse when she started anastrozole.  We discussed that on the clinical trials it was a 10% risk of headaches with anastrozole.  She is willing to try to continue with anastrozole and watch it. 2. neck pain: Also possibly unrelated to anastrozole.  She will go to once a day dosing of the anastrozole  starting next week.   Breast cancer surveillance: She will do annual mammograms in June or July.  Breast MRIs will be done every 2 to 3 years. Her breast density is category B.  Return to clinic for survivorship care plan visit and after that I will see her in 6 months and then annually thereafter.    No orders of the defined types were placed in this encounter.  The patient has a good understanding of the overall plan. she agrees with it. she will call with any problems that may develop before the next visit here. Total time spent: 30 mins including face to face time and time spent for planning, charting and co-ordination of care   Harriette Ohara, MD 10/11/22

## 2022-10-11 ENCOUNTER — Encounter: Payer: Self-pay | Admitting: Radiation Oncology

## 2022-10-11 ENCOUNTER — Ambulatory Visit
Admission: RE | Admit: 2022-10-11 | Discharge: 2022-10-11 | Disposition: A | Discharge status: Home / Self Care | Payer: PRIVATE HEALTH INSURANCE | Source: Ambulatory Visit | Attending: Radiation Oncology | Admitting: Radiation Oncology

## 2022-10-11 ENCOUNTER — Inpatient Hospital Stay (HOSPITAL_BASED_OUTPATIENT_CLINIC_OR_DEPARTMENT_OTHER): Payer: PRIVATE HEALTH INSURANCE | Admitting: Hematology and Oncology

## 2022-10-11 ENCOUNTER — Other Ambulatory Visit: Payer: Self-pay

## 2022-10-11 DIAGNOSIS — R519 Headache, unspecified: Secondary | ICD-10-CM | POA: Insufficient documentation

## 2022-10-11 DIAGNOSIS — Z79811 Long term (current) use of aromatase inhibitors: Secondary | ICD-10-CM | POA: Insufficient documentation

## 2022-10-11 DIAGNOSIS — D0511 Intraductal carcinoma in situ of right breast: Secondary | ICD-10-CM | POA: Insufficient documentation

## 2022-10-11 DIAGNOSIS — Z923 Personal history of irradiation: Secondary | ICD-10-CM | POA: Insufficient documentation

## 2022-10-11 NOTE — Progress Notes (Signed)
  Radiation Oncology         [phone] ________________________________  Name: Brianni Manthe MRN: [id number]  Date: 10/11/2022  DOB: 1961-05-22  Follow-Up Visit Note  Outpatient  CC: Harlan Stains, MD  Harlan Stains, MD  Diagnosis and Prior Radiotherapy:    ICD-10-CM   1. Ductal carcinoma in situ (DCIS) of right breast  D05.11       CHIEF COMPLAINT: Here for follow-up and surveillance of DCIS  Narrative:  The patient returns today for routine follow-up.  She is doing well overall.  She has a little crusty spot on the areola of the right breast.  She has purchased vitamin E cream to use over her skin now.  She just saw Dr. Lindi Adie today                             ALLERGIES:  is allergic to cephalexin.  Meds: Current Outpatient Medications  Medication Sig Dispense Refill   acetaminophen (TYLENOL) 500 MG tablet Take 500 mg by mouth every 6 (six) hours as needed.     anastrozole (ARIMIDEX) 1 MG tablet Take 1 tablet (1 mg total) by mouth daily. 90 tablet 3   aspirin-acetaminophen-caffeine (EXCEDRIN MIGRAINE) 497-026-37 MG per tablet Take 2 tablets by mouth as needed.      Calcium-Phosphorus-Vitamin D (CITRACAL +D3 PO) Take 1 each by mouth daily.     ibuprofen (ADVIL,MOTRIN) 200 MG tablet Take 600 mg by mouth as needed.     Multiple Vitamins-Minerals (ONE-A-DAY WOMENS PO) Take by mouth.     SUMAtriptan (IMITREX) 100 MG tablet TAKE ONE TABLET AT ONSET OF HEADACHE. MAY REPEAT IN TWO HOURS IF NEEDED. 27 tablet 4   No current facility-administered medications for this encounter.    Physical Findings: The patient is in no acute distress. Patient is alert and oriented.  vitals were not taken for this visit. .    Satisfactory skin healing in radiotherapy fields.  Right breast has healed beautifully.  She has a slight crusted area over her right areola that is not a clinical concern.  Mild erythema of areola and surgical scars.   Lab Findings: Lab Results  Component Value Date    WBC 4.9 07/16/2013   HGB 13.3 07/16/2013   HCT 39.1 07/16/2013   MCV 86.5 07/16/2013   PLT 215 07/16/2013    Radiographic Findings: No results found.  Impression/Plan: Healing well from radiotherapy to the breast tissue.  Continue skin care with topical Vitamin E cream twice daily for 2 more months for further healing.  I encouraged her to continue followup with medical oncology. I will see her back on an as-needed basis. I have encouraged her to call if she has any issues or concerns in the future. I wished her the very best.  On date of service, in total, I spent 15 minutes on this encounter. Patient was seen in person.  _____________________________________   Eppie Gibson, MD

## 2022-10-11 NOTE — Assessment & Plan Note (Addendum)
07/05/2022:Right lumpectomy: 5 mm high-grade DCIS with clear margins ER 90%, PR 5%  Treatment plan: 1.Adjuvant radiation therapy 08/08/2022-09/04/2022 2. Followed by antiestrogen therapy with tamoxifenversus anastrozole5 years started 09/04/2022 ---------------------------------------------------------------------------------------------------------------------------------- Anastrozole  toxicities: 1.  Frequent headaches: She had headaches previous to starting anastrozole and is unclear if it is getting any worse when she started anastrozole.  We discussed that on the clinical trials it was a 10% risk of headaches with anastrozole.  She is willing to try to continue with anastrozole and watch it. 2. neck pain: Also possibly unrelated to anastrozole.  She will go to once a day dosing of the anastrozole starting next week.   Breast cancer surveillance: She will do annual mammograms in June or July.  Breast MRIs will be done every 2 to 3 years. Her breast density is category B.  Return to clinic for survivorship care plan visit and after that I will see her in 6 months and then annually thereafter.

## 2022-10-11 NOTE — Progress Notes (Addendum)
Suzanne Hicks presents today for follow-up after completing radiation to her right breast on 09-04-22.   Pain: no pain Skin: crusty spot on affected side, otherwise good ROM: no problems with ROM Lymphedema: none to report MedOnc F/U: 09-04-22 with Dr. Lindi Adie Other issues of note: talk to her about creams and crusty spot   Pt reports Yes No Comments  Tamoxifen '[]'$  '[x]'$    Letrozole '[]'$  '[x]'$    Anastrazole '[x]'$  '[]'$    Mammogram '[]'$  Date: TBD '[]'$ 

## 2022-12-02 ENCOUNTER — Encounter: Payer: Self-pay | Admitting: Adult Health

## 2022-12-02 ENCOUNTER — Inpatient Hospital Stay: Payer: PRIVATE HEALTH INSURANCE | Attending: Radiation Oncology | Admitting: Adult Health

## 2022-12-02 ENCOUNTER — Telehealth: Payer: Self-pay

## 2022-12-02 ENCOUNTER — Other Ambulatory Visit: Payer: Self-pay

## 2022-12-02 VITALS — BP 100/62 | HR 81 | Temp 97.4°F | Resp 18

## 2022-12-02 DIAGNOSIS — Z803 Family history of malignant neoplasm of breast: Secondary | ICD-10-CM | POA: Diagnosis not present

## 2022-12-02 DIAGNOSIS — E2839 Other primary ovarian failure: Secondary | ICD-10-CM

## 2022-12-02 DIAGNOSIS — Z79811 Long term (current) use of aromatase inhibitors: Secondary | ICD-10-CM | POA: Insufficient documentation

## 2022-12-02 DIAGNOSIS — Z8052 Family history of malignant neoplasm of bladder: Secondary | ICD-10-CM | POA: Diagnosis not present

## 2022-12-02 DIAGNOSIS — M85852 Other specified disorders of bone density and structure, left thigh: Secondary | ICD-10-CM

## 2022-12-02 DIAGNOSIS — Z800 Family history of malignant neoplasm of digestive organs: Secondary | ICD-10-CM | POA: Insufficient documentation

## 2022-12-02 DIAGNOSIS — D0511 Intraductal carcinoma in situ of right breast: Secondary | ICD-10-CM | POA: Diagnosis present

## 2022-12-02 DIAGNOSIS — Z8042 Family history of malignant neoplasm of prostate: Secondary | ICD-10-CM | POA: Insufficient documentation

## 2022-12-02 DIAGNOSIS — Z807 Family history of other malignant neoplasms of lymphoid, hematopoietic and related tissues: Secondary | ICD-10-CM | POA: Diagnosis not present

## 2022-12-02 DIAGNOSIS — Z85828 Personal history of other malignant neoplasm of skin: Secondary | ICD-10-CM | POA: Diagnosis not present

## 2022-12-02 NOTE — Progress Notes (Signed)
SURVIVORSHIP VISIT:  BRIEF ONCOLOGIC HISTORY:  Oncology History  Ductal carcinoma in situ (DCIS) of right breast  06/14/2022 Initial Diagnosis   Screening mammogram detected right breast abnormality on biopsy came back as DCIS with calcifications and necrosis ER 90%, PR 5%   07/05/2022 Surgery   Right lumpectomy: 5 mm high-grade DCIS with clear margins ER 90%, PR 5%   08/07/2022 - 09/04/2022 Radiation Therapy   Site Technique Total Dose (Gy) Dose per Fx (Gy) Completed Fx Beam Energies  Breast, Right: Breast_R 3D 40.05/40.05 2.67 15/15 6XFFF  Breast, Right: Breast_R_Bst 3D 10/10 2 5/5 6X, 10X     09/2022 -  Anti-estrogen oral therapy   Anastrozole x 5 years     INTERVAL HISTORY:  Ms. Jiles to review her survivorship care plan detailing her treatment course for breast cancer, as well as monitoring long-term side effects of that treatment, education regarding health maintenance, screening, and overall wellness and health promotion.     Overall, Ms. Wisser reports feeling quite well. She is taking Anastrozole daily.  She experiences intermittent vaginal dryness, but is otherwise doing well.    REVIEW OF SYSTEMS:  Review of Systems  Constitutional:  Negative for appetite change, chills, fatigue, fever and unexpected weight change.  HENT:   Negative for hearing loss, lump/mass and trouble swallowing.   Eyes:  Negative for eye problems and icterus.  Respiratory:  Negative for chest tightness, cough and shortness of breath.   Cardiovascular:  Negative for chest pain, leg swelling and palpitations.  Gastrointestinal:  Negative for abdominal distention, abdominal pain, constipation, diarrhea, nausea and vomiting.  Endocrine: Negative for hot flashes.  Genitourinary:  Negative for difficulty urinating.   Musculoskeletal:  Negative for arthralgias.  Skin:  Negative for itching and rash.  Neurological:  Negative for dizziness, extremity weakness, headaches and numbness.  Hematological:  Negative  for adenopathy. Does not bruise/bleed easily.  Psychiatric/Behavioral:  Negative for depression. The patient is not nervous/anxious.   Breast: Denies any new nodularity, masses, tenderness, nipple changes, or nipple discharge.      PAST MEDICAL/SURGICAL HISTORY:  Past Medical History:  Diagnosis Date   Anemia    Anxiety    xanax   Arthritis    Bicornate uterus    Breast cancer ([place]) 06/2022   right breast DCIS   Family history of breast cancer    Generalized headaches    imitrex/tylenol    Headache    Hearing loss    bilateral - has hearing aids   History of melanoma    Insomnia    Melanoma ([place])    back of left leg   Skin cancer    Melanoma at 41   Past Surgical History:  Procedure Laterality Date   BREAST LUMPECTOMY WITH RADIOACTIVE SEED LOCALIZATION Right 07/05/2022   Procedure: RIGHT BREAST SEED GUIDED LUMPECTOMY;  Surgeon: Rolm Bookbinder, MD;  Location: [facility];  Service: General;  Laterality: Right;   [facility]  07/23/2013   Procedure: CYSTOSCOPY;  Surgeon: Reece Packer, MD;  Location: [facility];  Service: Urology;;   DILATION AND CURETTAGE OF UTERUS     MAB x 3   LAPAROSCOPIC TOTAL HYSTERECTOMY  12/14   at [facility]  07/23/2013   Procedure: LAPAROSCOPY DIAGNOSTIC;  Surgeon: Lyman Speller, MD;  Location: [facility];  Service: Gynecology;;   RADIOFREQUENCY ABLATION     Neck   SKIN BIOPSY  ALLERGIES:  Allergies  Allergen Reactions   Cephalexin Hives, Itching and Rash     CURRENT MEDICATIONS:  Outpatient Encounter Medications as of 12/02/2022  Medication Sig Note   anastrozole (ARIMIDEX) 1 MG tablet Take 1 tablet (1 mg total) by mouth daily.    aspirin-acetaminophen-caffeine (EXCEDRIN MIGRAINE) 250-250-65 MG per tablet Take 2 tablets by mouth as needed.     Calcium-Phosphorus-Vitamin D (CITRACAL +D3 PO) Take 1 each by mouth daily.    Multiple Vitamins-Minerals  (ONE-A-DAY WOMENS PO) Take by mouth.    acetaminophen (TYLENOL) 500 MG tablet Take 500 mg by mouth every 6 (six) hours as needed. (Patient not taking: Reported on 12/02/2022)    ibuprofen (ADVIL,MOTRIN) 200 MG tablet Take 600 mg by mouth as needed. (Patient not taking: Reported on 12/02/2022) 07/16/2013: Will not take prior to surgery   SUMAtriptan (IMITREX) 100 MG tablet TAKE ONE TABLET AT ONSET OF HEADACHE. MAY REPEAT IN TWO HOURS IF NEEDED. (Patient not taking: Reported on 12/02/2022)    [DISCONTINUED] doxycycline (VIBRAMYCIN) 100 MG capsule Take 100 mg by mouth 2 (two) times daily. (Patient not taking: Reported on 12/02/2022)    No facility-administered encounter medications on file as of 12/02/2022.     ONCOLOGIC FAMILY HISTORY:  Family History  Problem Relation Age of Onset   Breast cancer Mother 60       Recurrence   Other Mother        memory issue, had one seizure   Osteoporosis Mother    Alzheimer's disease Mother    Liver cancer Mother    Cancer Father        Prostate   High blood pressure Father    Other Father        neuropathy   GER disease Father        "vocal cord numbing"   Cancer Paternal Grandmother        Dwaine Gale cell-skin   Stroke Paternal Grandfather    Breast cancer Other 70       maternal cousin-bilateral mastectomy-fast growing   Bladder Cancer Maternal Grandmother        d. 8s   Lymphoma Paternal Aunt      SOCIAL HISTORY:  Social History   Socioeconomic History   Marital status: Married    Spouse name: Not on file   Number of children: 2   Years of education: Not on file   Highest education level: Not on file  Occupational History   Not on file  Tobacco Use   Smoking status: Never   Smokeless tobacco: Never  Vaping Use   Vaping Use: Never used  Substance and Sexual Activity   Alcohol use: Yes    Alcohol/week: 0.0 standard drinks of alcohol    Comment: socially   Drug use: Never   Sexual activity: Yes    Partners: Male    Birth  control/protection: None, Surgical    Comment: husband vasectomy, hysterectomy  Other Topics Concern   Not on file  Social History Narrative   Lives at home with her husband   Social Determinants of Health   Financial Resource Strain: Not on file  Food Insecurity: No Food Insecurity (07/04/2022)   Hunger Vital Sign    Worried About Running Out of Food in the Last Year: Never true    Ran Out of Food in the Last Year: Never true  Transportation Needs: No Transportation Needs (07/04/2022)   PRAPARE - Hydrologist (Medical): No  Lack of Transportation (Non-Medical): No  Physical Activity: Not on file  Stress: Stress Concern Present (07/04/2022)   Coin    Feeling of Stress : Rather much  Social Connections: Not on file  Intimate Partner Violence: Not on file     OBSERVATIONS/OBJECTIVE:  BP 100/62 (BP Location: Left Arm, Patient Position: Sitting)   Pulse 81   Temp (!) 97.4 F (36.3 C) (Tympanic)   Resp 18   LMP 10/31/2013   SpO2 100%  GENERAL: Patient is a well appearing female in no acute distress HEENT:  Sclerae anicteric.  Oropharynx clear and moist. No ulcerations or evidence of oropharyngeal candidiasis. Neck is supple.  NODES:  No cervical, supraclavicular, or axillary lymphadenopathy palpated.  BREAST EXAM:  right breast s/p lumpectomy and radiation, well healed, no sign of local recurrence   SKIN:  Clear with no obvious rashes or skin changes. No nail dyscrasia. NEURO:  Nonfocal. Well oriented.  Appropriate affect.   LABORATORY DATA:  None for this visit.  DIAGNOSTIC IMAGING:  None for this visit.      ASSESSMENT AND PLAN:  Ms.. Slape is a pleasant 61 y.o. female with Stage 0 right breast DCIS, ER+/PR+, diagnosed in 05/2022, treated with lumpectomy, adjuvant radiation therapy, and anti-estrogen therapy with Anastrozole beginning in 08/2022.  She presents to the  Survivorship Clinic for our initial meeting and routine follow-up post-completion of treatment for breast cancer.    1. Stage 0 right breast cancer:  Ms. Carrier is continuing to recover from definitive treatment for breast cancer. She will follow-up with her medical oncologist, Dr. Lindi Adie in 6 months with history and physical exam per surveillance protocol.  She will continue her anti-estrogen therapy with Anastrozole. Thus far, she is tolerating the Anastrozole well, with minimal side effects. She was instructed to make Dr. Lindi Adie or myself aware if she begins to experience any worsening side effects of the medication and I could see her back in clinic to help manage those side effects, as needed. Her mammogram is due 05/2023; orders placed today. Today, a comprehensive survivorship care plan and treatment summary was reviewed with the patient today detailing her breast cancer diagnosis, treatment course, potential late/long-term effects of treatment, appropriate follow-up care with recommendations for the future, and patient education resources.  A copy of this summary, along with a letter will be sent to the patient's primary care provider via mail/fax/In Basket message after today's visit.    2. Vaginal dryness: Counseled patient on coconut oil that she can use as a lubricant.    3. Bone health:  Given Ms. Rayon's age/history of breast cancer and her current treatment regimen including anti-estrogen therapy with Anastrozole, she is at risk for bone demineralization.  Her last DEXA scan was 11/17/2020, which showed osteopenia with a t score of -2.1 in the left femur.  She was given education on specific activities to promote bone health.  4. Cancer screening:  Due to Ms. Parady's history and her age, she should receive screening for skin cancers, colon cancer.  The information and recommendations are listed on the patient's comprehensive care plan/treatment summary and were reviewed in detail with the patient.     5. Health maintenance and wellness promotion: Ms. Flight was encouraged to consume 5-7 servings of fruits and vegetables per day. We reviewed the "Nutrition Rainbow" handout.  She was also encouraged to engage in moderate to vigorous exercise for 30 minutes per day most days of the  week. We discussed the [employer] fitness program, which is designed for cancer survivors to help them become more physically fit after cancer treatments.  She was instructed to limit her alcohol consumption and continue to abstain from tobacco use.     #. Support services/counseling: It is not uncommon for this period of the patient's cancer care trajectory to be one of many emotions and stressors. She was given information regarding our available services and encouraged to contact me with any questions or for help enrolling in any of our support group/programs.    Follow up instructions:    -Return to cancer center 6 months for f/u with Dr. Lindi Adie -Mammogram due in 05/2023 -See Dr. Donne Hazel in  -She is welcome to re/turn back to the Survivorship Clinic at any time; no additional follow-up needed at this time.  -Consider referral back to survivorship as a long-term survivor for continued surveillance  The patient was provided an opportunity to ask questions and all were answered. The patient agreed with the plan and demonstrated an understanding of the instructions.   Total encounter time:45 minutes*in face-to-face visit time, chart review, lab review, care coordination, order entry, and documentation of the encounter time.    Wilber Bihari, NP 12/02/22 9:09 AM Medical Oncology and Hematology [facility] [place], [place] [address] Tel. [phone]    Fax. [phone]  *Total Encounter Time as defined by the Centers for Medicare and Medicaid Services includes, in addition to the face-to-face time of a patient visit (documented in the note above) non-face-to-face time:  obtaining and reviewing outside history, ordering and reviewing medications, tests or procedures, care coordination (communications with other health care professionals or caregivers) and documentation in the medical record.

## 2022-12-02 NOTE — Progress Notes (Signed)
Attempted to establish SCP visit with pt by asking about medications and health maintenance hx. Pt disgruntled and states she did not bring medical records with her. Also states she thought this visit would be with NP and not a nurse. Explained position of nurse to pt and she states she does not wish to repeat herself to me and to NP. Refused to change into gown for breast exam stating she will not have a conversation dressed in a gown. Made Nurse Navigator aware of pt's demeanor and concerns.

## 2022-12-02 NOTE — Telephone Encounter (Signed)
Attempted to call [facility] to obtain pt records for colonoscopy and vaccine hx. On hold for 8 mins, I faxed request for records to be faxed to our office. My fax confrimation received. Awaiting records.

## 2022-12-04 ENCOUNTER — Telehealth: Payer: Self-pay | Admitting: Adult Health

## 2022-12-04 NOTE — Telephone Encounter (Signed)
Scheduled appointment per 12/18 los. Patient is aware.

## 2023-03-25 ENCOUNTER — Other Ambulatory Visit: Payer: Self-pay | Admitting: *Deleted

## 2023-03-25 MED ORDER — ANASTROZOLE 1 MG PO TABS: 1.0000 mg | ORAL_TABLET | Freq: Every day | ORAL | 3 refills | Status: DC

## 2023-03-26 ENCOUNTER — Other Ambulatory Visit: Payer: Self-pay | Admitting: *Deleted

## 2023-03-26 DIAGNOSIS — Z85820 Personal history of malignant melanoma of skin: Secondary | ICD-10-CM | POA: Diagnosis not present

## 2023-03-26 DIAGNOSIS — Z85828 Personal history of other malignant neoplasm of skin: Secondary | ICD-10-CM | POA: Diagnosis not present

## 2023-03-26 DIAGNOSIS — D2261 Melanocytic nevi of right upper limb, including shoulder: Secondary | ICD-10-CM | POA: Diagnosis not present

## 2023-03-26 DIAGNOSIS — L821 Other seborrheic keratosis: Secondary | ICD-10-CM | POA: Diagnosis not present

## 2023-03-26 MED ORDER — ANASTROZOLE 1 MG PO TABS: 1.0000 mg | ORAL_TABLET | Freq: Every day | ORAL | 0 refills | Status: DC

## 2023-03-26 MED ORDER — ANASTROZOLE 1 MG PO TABS: 1.0000 mg | ORAL_TABLET | Freq: Every day | ORAL | 12 refills | Status: DC

## 2023-03-26 NOTE — Telephone Encounter (Signed)
Received call from pt stating due to her insurance, she is needing a 30 day prescription with 12 refills be sent to [facility] in [facility].  Prescription sent to pharmacy per pt request.

## 2023-03-26 NOTE — Telephone Encounter (Signed)
Pt presented to the lobby requesting short fill hard prescription for Anastrozole.  Pt states she is waiting on 90 day prescription to come in the mail with [facility] pharmacy and would like to pick up a 30 day supply at [facility].  RN provided pt with hard script.

## 2023-04-08 DIAGNOSIS — H524 Presbyopia: Secondary | ICD-10-CM | POA: Diagnosis not present

## 2023-04-08 DIAGNOSIS — H2513 Age-related nuclear cataract, bilateral: Secondary | ICD-10-CM | POA: Diagnosis not present

## 2023-04-08 DIAGNOSIS — H0100B Unspecified blepharitis left eye, upper and lower eyelids: Secondary | ICD-10-CM | POA: Diagnosis not present

## 2023-04-08 DIAGNOSIS — H0100A Unspecified blepharitis right eye, upper and lower eyelids: Secondary | ICD-10-CM | POA: Diagnosis not present

## 2023-04-08 DIAGNOSIS — H04123 Dry eye syndrome of bilateral lacrimal glands: Secondary | ICD-10-CM | POA: Diagnosis not present

## 2023-04-08 DIAGNOSIS — H52203 Unspecified astigmatism, bilateral: Secondary | ICD-10-CM | POA: Diagnosis not present

## 2023-04-21 ENCOUNTER — Other Ambulatory Visit: Payer: Self-pay | Admitting: Hematology and Oncology

## 2023-05-30 ENCOUNTER — Ambulatory Visit
Admission: RE | Admit: 2023-05-30 | Discharge: 2023-05-30 | Disposition: A | Discharge status: Home / Self Care | Payer: BC Managed Care – PPO | Source: Ambulatory Visit | Attending: Adult Health | Admitting: Adult Health

## 2023-05-30 DIAGNOSIS — Z90710 Acquired absence of both cervix and uterus: Secondary | ICD-10-CM | POA: Diagnosis not present

## 2023-05-30 DIAGNOSIS — M85852 Other specified disorders of bone density and structure, left thigh: Secondary | ICD-10-CM

## 2023-05-30 DIAGNOSIS — E349 Endocrine disorder, unspecified: Secondary | ICD-10-CM | POA: Diagnosis not present

## 2023-05-30 DIAGNOSIS — D0511 Intraductal carcinoma in situ of right breast: Secondary | ICD-10-CM

## 2023-05-30 DIAGNOSIS — Z8262 Family history of osteoporosis: Secondary | ICD-10-CM | POA: Diagnosis not present

## 2023-05-30 DIAGNOSIS — Z853 Personal history of malignant neoplasm of breast: Secondary | ICD-10-CM | POA: Diagnosis not present

## 2023-05-30 DIAGNOSIS — E2839 Other primary ovarian failure: Secondary | ICD-10-CM

## 2023-06-08 NOTE — Progress Notes (Signed)
Patient Care Team: Laurann Montana, MD as PCP - General (Family Medicine) Emelia Loron, MD as Consulting Physician (General Surgery) Serena Croissant, MD as Consulting Physician (Hematology and Oncology) Lonie Peak, MD as Attending Physician (Radiation Oncology)  DIAGNOSIS:  Encounter Diagnosis  Name Primary?   Ductal carcinoma in situ (DCIS) of right breast Yes    SUMMARY OF ONCOLOGIC HISTORY: Oncology History  Ductal carcinoma in situ (DCIS) of right breast  06/14/2022 Initial Diagnosis   Screening mammogram detected right breast abnormality on biopsy came back as DCIS with calcifications and necrosis ER 90%, PR 5%   07/05/2022 Surgery   Right lumpectomy: 5 mm high-grade DCIS with clear margins ER 90%, PR 5%   08/07/2022 - 09/04/2022 Radiation Therapy   Site Technique Total Dose (Gy) Dose per Fx (Gy) Completed Fx Beam Energies  Breast, Right: Breast_R 3D 40.05/40.05 2.67 15/15 6XFFF  Breast, Right: Breast_R_Bst 3D 10/10 2 5/5 6X, 10X     09/2022 -  Anti-estrogen oral therapy   Anastrozole x 5 years   12/02/2022 Cancer Staging   Staging form: Breast, AJCC 8th Edition - Clinical: Stage 0 (cTis (DCIS), cN0, cM0, ER+, PR+) - Signed by Loa Socks, NP on 12/02/2022     CHIEF COMPLIANT:  Follow-up anastrozole    INTERVAL HISTORY: Suzanne Hicks is a 62 y.o. female is here because of recent diagnosis of right breast DCIS. Currently on anastrozole. She presents to the clinic for a follow-up. She reports that her health is ok. She still is having headaches from neck arthritis. She states that is not coming from the anastrozole. Denies hot flashes. She does have vaginal dryness. Denies any joint stiffness or any other side effects. She does swimming for exercising.   ALLERGIES:  is allergic to cephalexin.  MEDICATIONS:  Current Outpatient Medications  Medication Sig Dispense Refill   anastrozole (ARIMIDEX) 1 MG tablet TAKE 1 TABLET BY MOUTH DAILY 30 tablet 12    aspirin-acetaminophen-caffeine (EXCEDRIN MIGRAINE) 250-250-65 MG per tablet Take 2 tablets by mouth as needed.      Calcium-Phosphorus-Vitamin D (CITRACAL +D3 PO) Take 1 each by mouth daily.     cyclobenzaprine (FLEXERIL) 10 MG tablet Take 10 mg by mouth at bedtime.     Multiple Vitamins-Minerals (ONE-A-DAY WOMENS PO) Take by mouth.     SUMAtriptan (IMITREX) 100 MG tablet TAKE ONE TABLET AT ONSET OF HEADACHE. MAY REPEAT IN TWO HOURS IF NEEDED. (Patient not taking: Reported on 12/02/2022) 27 tablet 4   No current facility-administered medications for this visit.    PHYSICAL EXAMINATION: ECOG PERFORMANCE STATUS: 1 - Symptomatic but completely ambulatory  Vitals:   06/13/23 0808  BP: 106/63  Pulse: 70  Resp: 18  Temp: (!) 97.5 F (36.4 C)  SpO2: 100%   Filed Weights   06/13/23 0808  Weight: 163 lb (73.9 kg)    BREAST: No palpable masses or nodules in either right or left breasts. No palpable axillary supraclavicular or infraclavicular adenopathy no breast tenderness or nipple discharge. (exam performed in the presence of a chaperone)  LABORATORY DATA:  I have reviewed the data as listed    Latest Ref Rng & Units 07/28/2019    8:06 AM  CMP  Glucose 65 - 99 mg/dL 74   BUN 8 - 27 mg/dL 19   Creatinine 6.21 - 1.00 mg/dL 3.08   Sodium 657 - 846 mmol/L 143   Potassium 3.5 - 5.2 mmol/L 5.1   Chloride 96 - 106 mmol/L 105   CO2  20 - 29 mmol/L 22   Calcium 8.7 - 10.3 mg/dL 8.9     Lab Results  Component Value Date   WBC 4.9 07/16/2013   HGB 13.3 07/16/2013   HCT 39.1 07/16/2013   MCV 86.5 07/16/2013   PLT 215 07/16/2013    ASSESSMENT & PLAN:  Ductal carcinoma in situ (DCIS) of right breast 07/05/2022:Right lumpectomy: 5 mm high-grade DCIS with clear margins ER 90%, PR 5%   Treatment plan: 1. Adjuvant radiation therapy 08/08/2022-09/04/2022 2. Followed by antiestrogen therapy with tamoxifen versus anastrozole 5 years started  09/04/2022 ---------------------------------------------------------------------------------------------------------------------------------- Anastrozole  toxicities: 1.  Frequent headaches: unrelated to anastrozole 2. neck pain: Also possibly unrelated to anastrozole. 3.  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    Answer:   No Sedation    Order Specific Question:   Does the patient have a pacemaker or implanted devices?    Answer:   No    Order Specific Question:   Preferred imaging location?    Answer:   [facility] (table limit-550lbs)   The patient has a good understanding of the overall plan. she agrees with it. she will call with any problems that may develop before the next visit here. Total time spent: 30 mins including face to face time and time spent for planning, charting and co-ordination of care   Tamsen Meek, MD 06/13/23    I Janan Ridge am acting as a Neurosurgeon for [employer]  I have reviewed the above documentation for accuracy and completeness, and I agree with the above.

## 2023-06-11 DIAGNOSIS — M25562 Pain in left knee: Secondary | ICD-10-CM | POA: Diagnosis not present

## 2023-06-12 DIAGNOSIS — M5030 Other cervical disc degeneration, unspecified cervical region: Secondary | ICD-10-CM | POA: Diagnosis not present

## 2023-06-12 DIAGNOSIS — R519 Headache, unspecified: Secondary | ICD-10-CM | POA: Diagnosis not present

## 2023-06-12 DIAGNOSIS — G43009 Migraine without aura, not intractable, without status migrainosus: Secondary | ICD-10-CM | POA: Diagnosis not present

## 2023-06-13 ENCOUNTER — Inpatient Hospital Stay: Payer: BC Managed Care – PPO | Attending: Hematology and Oncology | Admitting: Hematology and Oncology

## 2023-06-13 VITALS — BP 106/63 | HR 70 | Temp 97.5°F | Resp 18 | Ht 68.0 in | Wt 163.0 lb

## 2023-06-13 DIAGNOSIS — D0511 Intraductal carcinoma in situ of right breast: Secondary | ICD-10-CM | POA: Diagnosis not present

## 2023-06-13 DIAGNOSIS — Z79811 Long term (current) use of aromatase inhibitors: Secondary | ICD-10-CM | POA: Diagnosis not present

## 2023-06-13 DIAGNOSIS — M542 Cervicalgia: Secondary | ICD-10-CM | POA: Diagnosis not present

## 2023-06-13 DIAGNOSIS — R519 Headache, unspecified: Secondary | ICD-10-CM | POA: Insufficient documentation

## 2023-06-13 NOTE — Assessment & Plan Note (Addendum)
07/05/2022:Right lumpectomy: 5 mm high-grade DCIS with clear margins ER 90%, PR 5%   Treatment plan: 1. Adjuvant radiation therapy 08/08/2022-09/04/2022 2. Followed by antiestrogen therapy with tamoxifen versus anastrozole 5 years started 09/04/2022 ---------------------------------------------------------------------------------------------------------------------------------- Anastrozole  toxicities: 1.  Frequent headaches: unrelated to anastrozole 2. neck pain: Also possibly unrelated to anastrozole. 3.  Vaginal dryness: Encouraged her to use Replens   Breast cancer surveillance:  Breast MRIs will be done in December Mammogram 05/30/2023: Benign breast density category B Breast exam 06/13/2023: Benign  Return to clinic in 1 year for follow-up

## 2023-06-16 ENCOUNTER — Telehealth: Payer: Self-pay | Admitting: Hematology and Oncology

## 2023-06-16 NOTE — Telephone Encounter (Signed)
Scheduled appointment per 6/28 los. Left voicemail.  

## 2023-06-25 ENCOUNTER — Telehealth: Payer: Self-pay | Admitting: Hematology and Oncology

## 2023-06-25 NOTE — Telephone Encounter (Signed)
Patient has scheduling conflict and needed  appointment scheduled for a different date; patient is aware of rescheduled dates/times

## 2023-08-22 DIAGNOSIS — G43719 Chronic migraine without aura, intractable, without status migrainosus: Secondary | ICD-10-CM | POA: Diagnosis not present

## 2023-08-22 DIAGNOSIS — Z049 Encounter for examination and observation for unspecified reason: Secondary | ICD-10-CM | POA: Diagnosis not present

## 2023-09-01 DIAGNOSIS — G518 Other disorders of facial nerve: Secondary | ICD-10-CM | POA: Diagnosis not present

## 2023-09-01 DIAGNOSIS — G43719 Chronic migraine without aura, intractable, without status migrainosus: Secondary | ICD-10-CM | POA: Diagnosis not present

## 2023-09-01 DIAGNOSIS — M791 Myalgia, unspecified site: Secondary | ICD-10-CM | POA: Diagnosis not present

## 2023-09-01 DIAGNOSIS — M542 Cervicalgia: Secondary | ICD-10-CM | POA: Diagnosis not present

## 2023-09-15 DIAGNOSIS — M542 Cervicalgia: Secondary | ICD-10-CM | POA: Diagnosis not present

## 2023-09-15 DIAGNOSIS — G43719 Chronic migraine without aura, intractable, without status migrainosus: Secondary | ICD-10-CM | POA: Diagnosis not present

## 2023-09-15 DIAGNOSIS — M791 Myalgia, unspecified site: Secondary | ICD-10-CM | POA: Diagnosis not present

## 2023-09-15 DIAGNOSIS — G518 Other disorders of facial nerve: Secondary | ICD-10-CM | POA: Diagnosis not present

## 2023-09-19 ENCOUNTER — Encounter (HOSPITAL_BASED_OUTPATIENT_CLINIC_OR_DEPARTMENT_OTHER): Payer: Self-pay | Admitting: Obstetrics & Gynecology

## 2023-09-19 ENCOUNTER — Ambulatory Visit (HOSPITAL_BASED_OUTPATIENT_CLINIC_OR_DEPARTMENT_OTHER): Payer: BC Managed Care – PPO | Admitting: Obstetrics & Gynecology

## 2023-09-19 VITALS — BP 117/71 | HR 70 | Ht 68.0 in | Wt 170.4 lb

## 2023-09-19 DIAGNOSIS — D0511 Intraductal carcinoma in situ of right breast: Secondary | ICD-10-CM | POA: Diagnosis not present

## 2023-09-19 DIAGNOSIS — Z01419 Encounter for gynecological examination (general) (routine) without abnormal findings: Secondary | ICD-10-CM

## 2023-09-19 DIAGNOSIS — M8580 Other specified disorders of bone density and structure, unspecified site: Secondary | ICD-10-CM

## 2023-09-19 DIAGNOSIS — Z803 Family history of malignant neoplasm of breast: Secondary | ICD-10-CM | POA: Diagnosis not present

## 2023-09-19 DIAGNOSIS — Z780 Asymptomatic menopausal state: Secondary | ICD-10-CM

## 2023-09-19 DIAGNOSIS — Z85820 Personal history of malignant melanoma of skin: Secondary | ICD-10-CM

## 2023-09-19 NOTE — Progress Notes (Signed)
62 y.o. [id number] Married White or Caucasian female here for annual exam.  Having some vaginal dryness.  Denies vaginal bleeding.    BMD done 05/30/2023 with T score -2.2.  This changed from -2.1 done 11/2020.    Having headaches that is related to neck arthritis.  Has just started seeing Dr. Neale Burly.    Patient's last menstrual period was 10/31/2013.          Sexually active: Yes.    The current method of family planning is post menopausal status.    Exercising: Yes.    Smoker:  no  Health Maintenance: Pap:  not indicated History of abnormal Pap:  no MMG:  05/30/2023 Colonoscopy:  10/2019, follow up 5 years BMD:   05/2023, -2.2 Screening Labs: does with Dr. Katrinka Blazing   reports that she has never smoked. She has never used smokeless tobacco. She reports current alcohol use. She reports that she does not use drugs.  Past Medical History:  Diagnosis Date   Anemia    Anxiety    xanax   Arthritis    Bicornate uterus    Breast cancer (HCC) 06/2022   right breast DCIS   Family history of breast cancer    Generalized headaches    imitrex/tylenol    Headache    Hearing loss    bilateral - has hearing aids   History of melanoma    Insomnia    Melanoma (HCC)    back of left leg   Skin cancer    Melanoma at 41    Past Surgical History:  Procedure Laterality Date   BREAST LUMPECTOMY WITH RADIOACTIVE SEED LOCALIZATION Right 07/05/2022   Procedure: RIGHT BREAST SEED GUIDED LUMPECTOMY;  Surgeon: Emelia Loron, MD;  Location: [facility];  Service: General;  Laterality: Right;   CESAREAN SECTION  1993   CESAREAN SECTION  1998   CYSTOSCOPY  07/23/2013   Procedure: CYSTOSCOPY;  Surgeon: Martina Sinner, MD;  Location: [facility];  Service: Urology;;   DILATION AND CURETTAGE OF UTERUS     MAB x 3   LAPAROSCOPIC TOTAL HYSTERECTOMY  12/14   at [facility]   LAPAROSCOPY  07/23/2013   Procedure: LAPAROSCOPY DIAGNOSTIC;  Surgeon: Annamaria Boots, MD;  Location: [facility];  Service:  Gynecology;;   RADIOFREQUENCY ABLATION     Neck   SKIN BIOPSY      Current Outpatient Medications  Medication Sig Dispense Refill   anastrozole (ARIMIDEX) 1 MG tablet TAKE 1 TABLET BY MOUTH DAILY 30 tablet 12   aspirin-acetaminophen-caffeine (EXCEDRIN MIGRAINE) 250-250-65 MG per tablet Take 2 tablets by mouth as needed.      baclofen (LIORESAL) 10 MG tablet Take by mouth.     Calcium-Phosphorus-Vitamin D (CITRACAL +D3 PO) Take 1 each by mouth daily.     Multiple Vitamins-Minerals (ONE-A-DAY WOMENS PO) Take by mouth.     SUMAtriptan (IMITREX) 100 MG tablet TAKE ONE TABLET AT ONSET OF HEADACHE. MAY REPEAT IN TWO HOURS IF NEEDED. 27 tablet 4   zonisamide (ZONEGRAN) 25 MG capsule Take by mouth 2 (two) times daily.     No current facility-administered medications for this visit.    Family History  Problem Relation Age of Onset   Breast cancer Mother 37       Recurrence   Other Mother        memory issue, had one seizure   Osteoporosis Mother    Alzheimer's disease Mother    Liver cancer Mother  Cancer Father        Prostate   High blood pressure Father    Other Father        neuropathy   GER disease Father        "vocal cord numbing"   Cancer Paternal Grandmother        Domingo Cocking cell-skin   Stroke Paternal Grandfather    Breast cancer Other 67       maternal cousin-bilateral mastectomy-fast growing   Bladder Cancer Maternal Grandmother        d. 55s   Lymphoma Paternal Aunt     ROS: Constitutional: negative Genitourinary:negative  Exam:   BP 117/71 (BP Location: Left Arm, Patient Position: Sitting, Cuff Size: Normal)   Pulse 70   Ht 5\' 8"  (1.727 m)   Wt 170 lb 6.4 oz (77.3 kg)   LMP 10/31/2013   BMI 25.91 kg/m   Height: 5\' 8"  (172.7 cm)  General appearance: alert, cooperative and appears stated age Head: Normocephalic, without obvious abnormality, atraumatic Neck: no adenopathy, supple, symmetrical, trachea midline and thyroid normal to inspection and  palpation Lungs: clear to auscultation bilaterally Breasts: normal appearance, no masses or tenderness Heart: regular rate and rhythm Abdomen: soft, non-tender; bowel sounds normal; no masses,  no organomegaly Extremities: extremities normal, atraumatic, no cyanosis or edema Skin: Skin color, texture, turgor normal. No rashes or lesions Lymph nodes: Cervical, supraclavicular, and axillary nodes normal. No abnormal inguinal nodes palpated Neurologic: Grossly normal   Pelvic: External genitalia:  no lesions              Urethra:  normal appearing urethra with no masses, tenderness or lesions              Bartholins and Skenes: normal                 Vagina: normal appearing vagina with normal color and no discharge, no lesions              Cervix: absent              Pap taken: No. Bimanual Exam:  Uterus:  uterus absent              Adnexa: no mass, fullness, tenderness               Rectovaginal: Confirms               Anus:  normal sphincter tone, no lesions  Chaperone, Ina Homes, CMA, was present for exam.  Assessment/Plan: 1. Well woman exam with routine gynecological exam - Pap smear not indicated - Mammogram 05/30/2023 - Colonoscopy 10/2019 - Bone mineral density 05/2023 - lab work done with PCP, Dr. Cliffton Asters - vaccines reviewed/updated  2. Osteopenia after menopause - calcium and vit d discussed - repeat 2 years  3. Ductal carcinoma in situ (DCIS) of right breast - followed by Dr. Pamelia Hoit - on Anstrazole  4. Family history of breast cancer  5. History of melanoma -sees dermatology

## 2023-09-19 NOTE — Patient Instructions (Signed)
Non hormonal  Coconut oil twice weekly Replens vaginal moisturizer twice weekly Revaree - vaginal hyaluronic acid suppository twice weekly Vit E vaginal suppositories   Hormonal: Estradiol cream -- twice weekly (prescription)

## 2023-09-24 DIAGNOSIS — D2261 Melanocytic nevi of right upper limb, including shoulder: Secondary | ICD-10-CM | POA: Diagnosis not present

## 2023-09-24 DIAGNOSIS — Z85828 Personal history of other malignant neoplasm of skin: Secondary | ICD-10-CM | POA: Diagnosis not present

## 2023-09-24 DIAGNOSIS — L821 Other seborrheic keratosis: Secondary | ICD-10-CM | POA: Diagnosis not present

## 2023-09-24 DIAGNOSIS — Z85820 Personal history of malignant melanoma of skin: Secondary | ICD-10-CM | POA: Diagnosis not present

## 2023-09-29 DIAGNOSIS — G43719 Chronic migraine without aura, intractable, without status migrainosus: Secondary | ICD-10-CM | POA: Diagnosis not present

## 2023-09-29 DIAGNOSIS — G518 Other disorders of facial nerve: Secondary | ICD-10-CM | POA: Diagnosis not present

## 2023-09-29 DIAGNOSIS — M542 Cervicalgia: Secondary | ICD-10-CM | POA: Diagnosis not present

## 2023-09-29 DIAGNOSIS — M791 Myalgia, unspecified site: Secondary | ICD-10-CM | POA: Diagnosis not present

## 2023-10-14 DIAGNOSIS — G43719 Chronic migraine without aura, intractable, without status migrainosus: Secondary | ICD-10-CM | POA: Diagnosis not present

## 2023-10-14 DIAGNOSIS — M791 Myalgia, unspecified site: Secondary | ICD-10-CM | POA: Diagnosis not present

## 2023-10-14 DIAGNOSIS — G518 Other disorders of facial nerve: Secondary | ICD-10-CM | POA: Diagnosis not present

## 2023-10-14 DIAGNOSIS — M542 Cervicalgia: Secondary | ICD-10-CM | POA: Diagnosis not present

## 2023-10-20 ENCOUNTER — Telehealth: Payer: Self-pay

## 2023-10-20 NOTE — Telephone Encounter (Signed)
Pt called and was asking about PA for her MRI. I explained the process for PA to the pt and she was adamant that we start the process no later than tomorrow, 11/5.  She is highly concerned because she has new insurance this year, and they will likely need a clinical review since she is a new member to them. She said she absolutely can NOT r/s her scan if for some reason it is cancelled. Pt was demanding we start the process right away to allow sufficient time for clinical review if needed. She said she is going to call us by the end of the week to check the status of this.

## 2023-10-28 DIAGNOSIS — G43719 Chronic migraine without aura, intractable, without status migrainosus: Secondary | ICD-10-CM | POA: Diagnosis not present

## 2023-10-28 DIAGNOSIS — M542 Cervicalgia: Secondary | ICD-10-CM | POA: Diagnosis not present

## 2023-10-28 DIAGNOSIS — M791 Myalgia, unspecified site: Secondary | ICD-10-CM | POA: Diagnosis not present

## 2023-10-28 DIAGNOSIS — G518 Other disorders of facial nerve: Secondary | ICD-10-CM | POA: Diagnosis not present

## 2023-11-03 DIAGNOSIS — M8588 Other specified disorders of bone density and structure, other site: Secondary | ICD-10-CM | POA: Diagnosis not present

## 2023-11-03 DIAGNOSIS — E785 Hyperlipidemia, unspecified: Secondary | ICD-10-CM | POA: Diagnosis not present

## 2023-11-03 DIAGNOSIS — Z0000 Encounter for general adult medical examination without abnormal findings: Secondary | ICD-10-CM | POA: Diagnosis not present

## 2023-11-03 DIAGNOSIS — M791 Myalgia, unspecified site: Secondary | ICD-10-CM | POA: Diagnosis not present

## 2023-11-03 DIAGNOSIS — M2550 Pain in unspecified joint: Secondary | ICD-10-CM | POA: Diagnosis not present

## 2023-11-11 DIAGNOSIS — M791 Myalgia, unspecified site: Secondary | ICD-10-CM | POA: Diagnosis not present

## 2023-11-11 DIAGNOSIS — M542 Cervicalgia: Secondary | ICD-10-CM | POA: Diagnosis not present

## 2023-11-11 DIAGNOSIS — G43719 Chronic migraine without aura, intractable, without status migrainosus: Secondary | ICD-10-CM | POA: Diagnosis not present

## 2023-11-11 DIAGNOSIS — G518 Other disorders of facial nerve: Secondary | ICD-10-CM | POA: Diagnosis not present

## 2023-11-21 ENCOUNTER — Ambulatory Visit
Admission: RE | Admit: 2023-11-21 | Discharge: 2023-11-21 | Disposition: A | Discharge status: Home / Self Care | Payer: BC Managed Care – PPO | Source: Ambulatory Visit | Attending: Hematology and Oncology | Admitting: Hematology and Oncology

## 2023-11-21 ENCOUNTER — Telehealth: Payer: Self-pay | Admitting: Hematology and Oncology

## 2023-11-21 DIAGNOSIS — Z853 Personal history of malignant neoplasm of breast: Secondary | ICD-10-CM | POA: Diagnosis not present

## 2023-11-21 DIAGNOSIS — Z1239 Encounter for other screening for malignant neoplasm of breast: Secondary | ICD-10-CM | POA: Diagnosis not present

## 2023-11-21 DIAGNOSIS — D0511 Intraductal carcinoma in situ of right breast: Secondary | ICD-10-CM

## 2023-11-21 MED ORDER — GADOPICLENOL 0.5 MMOL/ML IV SOLN
8.0000 mL | Freq: Once | INTRAVENOUS | Status: AC | PRN
  Administered 2023-11-21: 8 mL via INTRAVENOUS

## 2023-11-21 NOTE — Telephone Encounter (Signed)
I informed the patient that her breast MRI is completely normal.

## 2023-12-08 DIAGNOSIS — G518 Other disorders of facial nerve: Secondary | ICD-10-CM | POA: Diagnosis not present

## 2023-12-08 DIAGNOSIS — M542 Cervicalgia: Secondary | ICD-10-CM | POA: Diagnosis not present

## 2023-12-08 DIAGNOSIS — G43719 Chronic migraine without aura, intractable, without status migrainosus: Secondary | ICD-10-CM | POA: Diagnosis not present

## 2023-12-08 DIAGNOSIS — M791 Myalgia, unspecified site: Secondary | ICD-10-CM | POA: Diagnosis not present

## 2024-01-09 DIAGNOSIS — D23111 Other benign neoplasm of skin of right upper eyelid, including canthus: Secondary | ICD-10-CM | POA: Diagnosis not present

## 2024-01-09 DIAGNOSIS — H01004 Unspecified blepharitis left upper eyelid: Secondary | ICD-10-CM | POA: Diagnosis not present

## 2024-01-09 DIAGNOSIS — H01001 Unspecified blepharitis right upper eyelid: Secondary | ICD-10-CM | POA: Diagnosis not present

## 2024-01-19 DIAGNOSIS — G518 Other disorders of facial nerve: Secondary | ICD-10-CM | POA: Diagnosis not present

## 2024-01-19 DIAGNOSIS — M791 Myalgia, unspecified site: Secondary | ICD-10-CM | POA: Diagnosis not present

## 2024-01-19 DIAGNOSIS — G43719 Chronic migraine without aura, intractable, without status migrainosus: Secondary | ICD-10-CM | POA: Diagnosis not present

## 2024-01-19 DIAGNOSIS — M542 Cervicalgia: Secondary | ICD-10-CM | POA: Diagnosis not present

## 2024-02-18 ENCOUNTER — Other Ambulatory Visit: Payer: Self-pay | Admitting: Hematology and Oncology

## 2024-02-18 ENCOUNTER — Telehealth: Payer: Self-pay | Admitting: *Deleted

## 2024-02-18 DIAGNOSIS — Z853 Personal history of malignant neoplasm of breast: Secondary | ICD-10-CM

## 2024-02-18 NOTE — Telephone Encounter (Signed)
 Received VM from pt. RN attempt x1 to return call, no answer, LVM to return call to office.

## 2024-02-18 NOTE — Telephone Encounter (Signed)
 Received call from pt with complaint of slight joint pain while on Anastrozole.  RN reviewed with MD who suggest pt take OTC Turmeric to see if symptoms improve as well as daily stretching.  Pt educated and verbalized understanding.

## 2024-03-02 DIAGNOSIS — M791 Myalgia, unspecified site: Secondary | ICD-10-CM | POA: Diagnosis not present

## 2024-03-02 DIAGNOSIS — G43719 Chronic migraine without aura, intractable, without status migrainosus: Secondary | ICD-10-CM | POA: Diagnosis not present

## 2024-03-02 DIAGNOSIS — M542 Cervicalgia: Secondary | ICD-10-CM | POA: Diagnosis not present

## 2024-03-02 DIAGNOSIS — G518 Other disorders of facial nerve: Secondary | ICD-10-CM | POA: Diagnosis not present

## 2024-03-19 DIAGNOSIS — E785 Hyperlipidemia, unspecified: Secondary | ICD-10-CM | POA: Diagnosis not present

## 2024-03-22 DIAGNOSIS — Z85828 Personal history of other malignant neoplasm of skin: Secondary | ICD-10-CM | POA: Diagnosis not present

## 2024-03-22 DIAGNOSIS — Z85820 Personal history of malignant melanoma of skin: Secondary | ICD-10-CM | POA: Diagnosis not present

## 2024-03-22 DIAGNOSIS — L821 Other seborrheic keratosis: Secondary | ICD-10-CM | POA: Diagnosis not present

## 2024-03-22 DIAGNOSIS — D044 Carcinoma in situ of skin of scalp and neck: Secondary | ICD-10-CM | POA: Diagnosis not present

## 2024-03-22 DIAGNOSIS — D2261 Melanocytic nevi of right upper limb, including shoulder: Secondary | ICD-10-CM | POA: Diagnosis not present

## 2024-04-11 ENCOUNTER — Other Ambulatory Visit: Payer: Self-pay | Admitting: Hematology and Oncology

## 2024-04-13 DIAGNOSIS — M791 Myalgia, unspecified site: Secondary | ICD-10-CM | POA: Diagnosis not present

## 2024-04-13 DIAGNOSIS — G43719 Chronic migraine without aura, intractable, without status migrainosus: Secondary | ICD-10-CM | POA: Diagnosis not present

## 2024-04-13 DIAGNOSIS — M542 Cervicalgia: Secondary | ICD-10-CM | POA: Diagnosis not present

## 2024-04-13 DIAGNOSIS — G518 Other disorders of facial nerve: Secondary | ICD-10-CM | POA: Diagnosis not present

## 2024-04-26 ENCOUNTER — Telehealth: Payer: Self-pay

## 2024-04-26 NOTE — Telephone Encounter (Signed)
 Pt called and would like to have her rx refills on anastrozole  "lined up" with annual MD visits. She states the schedule is off. She currently has 30 day supplies and switched to [facility] 04/15/24 so she is interested in 90 day supply if medicare will cover that; she is going to call to find out her benefit eligibility. She states she will need to pick up RX at beginning of June. She has MD f.u 6/26. Advised pt to use current Rx and at MD visit she can ask MD to cancel current rx and re-enter order for 90 days with 4 refills which will give her a year supply. She is agreeable to this and will mention it at her MD appt.

## 2024-06-03 ENCOUNTER — Ambulatory Visit
Admission: RE | Admit: 2024-06-03 | Discharge: 2024-06-03 | Disposition: A | Discharge status: Home / Self Care | Source: Ambulatory Visit | Attending: Hematology and Oncology

## 2024-06-03 DIAGNOSIS — Z853 Personal history of malignant neoplasm of breast: Secondary | ICD-10-CM

## 2024-06-09 NOTE — Assessment & Plan Note (Signed)
 07/05/2022:Right lumpectomy: 5 mm high-grade DCIS with clear margins ER 90%, PR 5%   Treatment plan: 1. Adjuvant radiation therapy 08/08/2022-09/04/2022 2. Followed by antiestrogen therapy with tamoxifen versus anastrozole  5 years started 09/04/2022 ---------------------------------------------------------------------------------------------------------------------------------- Anastrozole   toxicities: 1.  Frequent headaches: unrelated to anastrozole  2. neck pain: Also possibly unrelated to anastrozole . 3.  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

## 2024-06-10 ENCOUNTER — Inpatient Hospital Stay: Payer: PRIVATE HEALTH INSURANCE | Attending: Hematology and Oncology | Admitting: Hematology and Oncology

## 2024-06-10 ENCOUNTER — Telehealth: Payer: Self-pay | Admitting: Hematology and Oncology

## 2024-06-10 VITALS — BP 124/80 | HR 75 | Temp 97.9°F | Resp 18 | Ht 68.5 in | Wt 150.7 lb

## 2024-06-10 DIAGNOSIS — D0511 Intraductal carcinoma in situ of right breast: Secondary | ICD-10-CM | POA: Insufficient documentation

## 2024-06-10 DIAGNOSIS — Z923 Personal history of irradiation: Secondary | ICD-10-CM | POA: Insufficient documentation

## 2024-06-10 DIAGNOSIS — Z780 Asymptomatic menopausal state: Secondary | ICD-10-CM | POA: Diagnosis not present

## 2024-06-10 DIAGNOSIS — Z803 Family history of malignant neoplasm of breast: Secondary | ICD-10-CM

## 2024-06-10 DIAGNOSIS — Z170 Estrogen receptor positive status [ER+]: Secondary | ICD-10-CM | POA: Diagnosis not present

## 2024-06-10 DIAGNOSIS — Z79811 Long term (current) use of aromatase inhibitors: Secondary | ICD-10-CM | POA: Insufficient documentation

## 2024-06-10 DIAGNOSIS — Z1721 Progesterone receptor positive status: Secondary | ICD-10-CM | POA: Diagnosis not present

## 2024-06-10 DIAGNOSIS — M85852 Other specified disorders of bone density and structure, left thigh: Secondary | ICD-10-CM

## 2024-06-10 MED ORDER — ANASTROZOLE 1 MG PO TABS: 1.0000 mg | ORAL_TABLET | Freq: Every day | ORAL | 3 refills | Status: AC

## 2024-06-10 NOTE — Progress Notes (Signed)
 Patient Care Team: Teresa Channel, MD as PCP - General (Family Medicine) Ebbie Cough, MD as Consulting Physician (General Surgery) Odean Potts, MD as Consulting Physician (Hematology and Oncology) Izell Domino, MD as Attending Physician (Radiation Oncology)  DIAGNOSIS:  Encounter Diagnosis  Name Primary?   Ductal carcinoma in situ (DCIS) of right breast Yes    SUMMARY OF ONCOLOGIC HISTORY: Oncology History  Ductal carcinoma in situ (DCIS) of right breast  06/14/2022 Initial Diagnosis   Screening mammogram detected right breast abnormality on biopsy came back as DCIS with calcifications and necrosis ER 90%, PR 5%   07/05/2022 Surgery   Right lumpectomy: 5 mm high-grade DCIS with clear margins ER 90%, PR 5%   08/07/2022 - 09/04/2022 Radiation Therapy   Site Technique Total Dose (Gy) Dose per Fx (Gy) Completed Fx Beam Energies  Breast, Right: Breast_R 3D 40.05/40.05 2.67 15/15 6XFFF  Breast, Right: Breast_R_Bst 3D 10/10 2 5/5 6X, 10X     09/2022 -  Anti-estrogen oral therapy   Anastrozole  x 5 years   12/02/2022 Cancer Staging   Staging form: Breast, AJCC 8th Edition - Clinical: Stage 0 (cTis (DCIS), cN0, cM0, ER+, PR+) - Signed by Crawford Morna Pickle, NP on 12/02/2022     CHIEF COMPLIANT:   HISTORY OF PRESENT ILLNESS: Discussed the use of AI scribe software for clinical note transcription with the patient, who gave verbal consent to proceed.  History of Present Illness Suzanne Hicks is a 63 year old female with ductal carcinoma in situ (DCIS) who presents for follow-up on anastrozole  treatment and breast cancer surveillance.  She has been on anastrozole  for nearly two years and initially experienced muscle aches and pains, particularly during the winter months, which have improved with increased physical activity, specifically swimming. She manages these symptoms without magnesium supplements.  She takes calcium supplements totaling 650 mg daily, considering  dietary sources and concerns about excessive intake of other minerals.  Her history includes DCIS with grade 3 aggressive cells, treated with surgery and radiation therapy, without lymph node removal. She undergoes regular diagnostic mammograms and MRIs for surveillance. She recently transitioned to [facility] and is uncertain about coverage for these diagnostic tests.  Her family history is significant for her mother having metastatic breast cancer and a cousin with aggressive breast cancer. She was previously approved for MRIs by a genetic counselor due to this history.  She is currently taking anastrozole  and has experienced issues with prescription refills aligning with her appointment schedule. She seeks a prescription that will last until her next annual appointment.     ALLERGIES:  is allergic to cephalexin.  MEDICATIONS:  Current Outpatient Medications  Medication Sig Dispense Refill   anastrozole  (ARIMIDEX ) 1 MG tablet TAKE 1 TABLET BY MOUTH DAILY 30 tablet 12   aspirin-acetaminophen -caffeine (EXCEDRIN MIGRAINE) 250-250-65 MG per tablet Take 2 tablets by mouth as needed.      baclofen (LIORESAL) 10 MG tablet Take by mouth. (Patient taking differently: Take by mouth. As needed)     Multiple Vitamins-Minerals (ONE-A-DAY WOMENS PO) Take by mouth.     zonisamide (ZONEGRAN) 25 MG capsule Take by mouth 2 (two) times daily.     Calcium-Phosphorus-Vitamin D (CITRACAL +D3 PO) Take 1 each by mouth daily.     SUMAtriptan  (IMITREX ) 100 MG tablet TAKE ONE TABLET AT ONSET OF HEADACHE. MAY REPEAT IN TWO HOURS IF NEEDED. (Patient not taking: Reported on 06/10/2024) 27 tablet 4   No current facility-administered medications for this visit.    PHYSICAL EXAMINATION:  ECOG PERFORMANCE STATUS: 1 - Symptomatic but completely ambulatory  Vitals:   06/10/24 0816  BP: 124/80  Pulse: 75  Resp: 18  Temp: 97.9 F (36.6 C)  SpO2: 100%   Filed Weights   06/10/24 0816  Weight: 150 lb 11.2 oz (68.4 kg)     Physical Exam BREAST: Breasts symmetrical, no masses, no tenderness.  (exam performed in the presence of a chaperone)  LABORATORY DATA:  I have reviewed the data as listed    Latest Ref Rng & Units 07/28/2019    8:06 AM  CMP  Glucose 65 - 99 mg/dL 74   BUN 8 - 27 mg/dL 19   Creatinine 9.42 - 1.00 mg/dL 9.40   Sodium 865 - 855 mmol/L 143   Potassium 3.5 - 5.2 mmol/L 5.1   Chloride 96 - 106 mmol/L 105   CO2 20 - 29 mmol/L 22   Calcium 8.7 - 10.3 mg/dL 8.9     Lab Results  Component Value Date   WBC 4.9 07/16/2013   HGB 13.3 07/16/2013   HCT 39.1 07/16/2013   MCV 86.5 07/16/2013   PLT 215 07/16/2013    ASSESSMENT & PLAN:  Ductal carcinoma in situ (DCIS) of right breast 07/05/2022:Right lumpectomy: 5 mm high-grade DCIS with clear margins ER 90%, PR 5%   Treatment plan: 1. Adjuvant radiation therapy 08/08/2022-09/04/2022 2. Followed by antiestrogen therapy with tamoxifen versus anastrozole  5 years started 09/04/2022 ---------------------------------------------------------------------------------------------------------------------------------- Anastrozole   toxicities: 1.  Frequent headaches: unrelated to anastrozole  2. neck pain: Also possibly unrelated to anastrozole . 3.  Vaginal dryness: Encouraged her to use Replens   Breast cancer surveillance:  Breast MRIs 11/21/2023: Benign breast density category B so she is not a 90 2033 patient's cross Mammogram 06/03/2024: Benign breast density category B Breast exam 06/11/2023: Benign  We will order breast MRI for December 2025. From next year onwards we will do contrast-enhanced mammograms.     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 Grade 3 cells removed, no lymph node involvement. On anastrozole  for two years, well-tolerated.  Discussed CEM as alternative imaging. - Continue anastrozole  1 mg daily. - Switch to contrast-enhanced mammogram (CEM) annually starting June 2026. - Perform MRI in December 2025. - Order bone density scan in June 2026 at [facility] radiology. - Cancel current anastrozole  prescription and issue a new prescription for a three-month supply for one year. - Schedule follow-up in one year.      No orders of the defined types were placed in this encounter.  The patient has a good understanding of the overall plan. she agrees with it. she will call with any problems that may develop before the next visit here. Total time spent: 30 mins including face to face time and time spent for planning, charting and co-ordination of care   Naomi MARLA Chad, MD 06/10/24

## 2024-06-10 NOTE — Telephone Encounter (Signed)
 Pt did not want to scheduled her year until she had her scan scheduled first, stated she will call to schedule.

## 2024-06-14 ENCOUNTER — Ambulatory Visit: Payer: PRIVATE HEALTH INSURANCE | Admitting: Hematology and Oncology

## 2024-06-16 ENCOUNTER — Inpatient Hospital Stay: Admitting: Hematology and Oncology

## 2024-09-09 ENCOUNTER — Ambulatory Visit (HOSPITAL_BASED_OUTPATIENT_CLINIC_OR_DEPARTMENT_OTHER): Payer: BC Managed Care – PPO | Admitting: Obstetrics & Gynecology

## 2024-09-13 ENCOUNTER — Ambulatory Visit (HOSPITAL_BASED_OUTPATIENT_CLINIC_OR_DEPARTMENT_OTHER): Payer: BC Managed Care – PPO | Admitting: Obstetrics & Gynecology

## 2024-09-21 ENCOUNTER — Ambulatory Visit (HOSPITAL_BASED_OUTPATIENT_CLINIC_OR_DEPARTMENT_OTHER): Admitting: Obstetrics & Gynecology

## 2024-09-29 ENCOUNTER — Ambulatory Visit (INDEPENDENT_AMBULATORY_CARE_PROVIDER_SITE_OTHER): Admitting: Obstetrics & Gynecology

## 2024-09-29 ENCOUNTER — Encounter (HOSPITAL_BASED_OUTPATIENT_CLINIC_OR_DEPARTMENT_OTHER): Payer: Self-pay | Admitting: Obstetrics & Gynecology

## 2024-09-29 VITALS — BP 99/64 | HR 64 | Ht 68.5 in | Wt 152.0 lb

## 2024-09-29 DIAGNOSIS — Z780 Asymptomatic menopausal state: Secondary | ICD-10-CM

## 2024-09-29 DIAGNOSIS — Z803 Family history of malignant neoplasm of breast: Secondary | ICD-10-CM

## 2024-09-29 DIAGNOSIS — D0511 Intraductal carcinoma in situ of right breast: Secondary | ICD-10-CM

## 2024-09-29 DIAGNOSIS — M8580 Other specified disorders of bone density and structure, unspecified site: Secondary | ICD-10-CM | POA: Diagnosis not present

## 2024-09-29 DIAGNOSIS — Z9189 Other specified personal risk factors, not elsewhere classified: Secondary | ICD-10-CM | POA: Diagnosis not present

## 2024-09-29 NOTE — Progress Notes (Signed)
 Breast and Pelvic Exam Patient name: Suzanne Hicks MRN [id number]  Date of birth: Dec 23, 1960 Chief Complaint:   H/o DCIS  History of Present Illness:   Suzanne Hicks is a 63 y.o. [phone] Caucasian female being seen today for breast and pelvic exam.  H/o DCIS s/p lumpectomy.  On anastrazole.  H/o osteopenia.    Taking one a day calcium with Vit D and other vitamins in addition to Multivitamin. Wanted to check about the amount of calcium, vit D but also other vitamins she is getting with theses supplement.  Suggested just taking One A Day calcium with Vit D and not the one with additional vitamins in it.    Denies vaignal bleeding.  Having a lot of vaginal irritation and dryness.    Patient's last menstrual period was 10/31/2013.  Last pap: Hysterectomy. H/O abnormal pap: yes Last mammogram: 06/03/2024. Results were: normal. Family h/o breast cancer: yes, mother and maternal cousin. Last colonoscopy: 04/07/2024.  F/u 5 years.   Dexa:  05/30/2023      09/29/2024   10:31 AM 09/19/2023    8:29 AM 09/09/2022    8:20 AM 09/06/2021    8:50 AM  Depression screen PHQ 2/9  Decreased Interest 0 0 0 0  Down, Depressed, Hopeless 0 0 0 0  PHQ - 2 Score 0 0 0 0     Review of Systems:   Pertinent items are noted in HPI Denies any urinary or bowel changes, denies pelvic pain Pertinent History Reviewed:  Reviewed past medical,surgical, social and family history.  Reviewed problem list, medications and allergies. Physical Assessment:   Vitals:   09/29/24 1021  BP: 99/64  Pulse: 64  SpO2: 100%  Weight: 152 lb (68.9 kg)  Height: 5' 8.5 (1.74 m)  Body mass index is 22.78 kg/m.        Physical Examination:   General appearance - well appearing, and in no distress  Mental status - alert, oriented to person, place, and time  Psych:  She has a normal mood and affect  Skin - warm and dry, normal color, no suspicious lesions noted  Chest - effort normal, all lung fields clear to auscultation  bilaterally  Heart - normal rate and regular rhythm  Neck:  midline trachea, no thyromegaly or nodules  Breasts - breasts appear normal, no suspicious masses, no skin or nipple changes or  axillary nodes  Abdomen - soft, nontender, nondistended, no masses or organomegaly  Pelvic - VULVA: normal appearing vulva with no masses, tenderness or lesions   VAGINA: normal appearing vagina with normal color and discharge, no lesions   CERVIX: normal appearing cervix without discharge or lesions, no CMT  Thin prep pap is not indicated  UTERUS: uterus is felt to be normal size, shape, consistency and nontender   ADNEXA: No adnexal masses or tenderness noted.  Rectal - normal rectal, good sphincter tone, no masses felt.  Extremities:  No swelling or varicosities noted  Chaperone present for exam  No results found for this or any previous visit (from the past 24 hours).  Assessment & Plan:  1. GYN exam for high-risk Medicare patient (Primary) - Pap smear not indicated du eto hysterectomy - Mammogram done 05/2024.  CEM planned for next year.  Has MRI scheduled for December - Colonoscopy 2025 - Bone mineral density due next year and already scheduled - lab work done with PCP, Dr. teresa - vaccines reviewed/updated  2. Ductal carcinoma in situ (DCIS) of right breast - followed  by Dr. Gudena  3. Family history of breast cancer - has done genetic testing  4. Osteopenia - calcium and Vit D dosing discussed  No orders of the defined types were placed in this encounter.   Meds: No orders of the defined types were placed in this encounter.   Ronal GORMAN [place], MD 09/29/2024 11:15 AM

## 2024-10-05 ENCOUNTER — Ambulatory Visit (HOSPITAL_BASED_OUTPATIENT_CLINIC_OR_DEPARTMENT_OTHER): Payer: BC Managed Care – PPO | Admitting: Obstetrics & Gynecology

## 2024-11-26 ENCOUNTER — Ambulatory Visit
Admission: RE | Admit: 2024-11-26 | Discharge: 2024-11-26 | Disposition: A | Source: Ambulatory Visit | Attending: Hematology and Oncology

## 2024-11-26 DIAGNOSIS — Z803 Family history of malignant neoplasm of breast: Secondary | ICD-10-CM

## 2024-11-26 DIAGNOSIS — D0511 Intraductal carcinoma in situ of right breast: Secondary | ICD-10-CM

## 2024-11-26 MED ORDER — GADOPICLENOL 0.5 MMOL/ML IV SOLN
7.5000 mL | Freq: Once | INTRAVENOUS | Status: AC | PRN
  Administered 2024-11-26: 10:00:00 7.5 mL via INTRAVENOUS

## 2024-12-06 ENCOUNTER — Telehealth: Payer: Self-pay

## 2024-12-06 NOTE — Telephone Encounter (Signed)
 Pt declined offer for phone visit. She was verbally upset about needing an appt. She states she has to babysit her 65 yr old grandchild because she has a 71 month old grand daughter who has to have open heart surgery 1/7. She states she just needs to understand if she still has clips or seeds because when she goes for her scans, they ask on paper. She does not want to pay for a visit. Dr Odean overheard this call and states he will call the pt in the morning to discuss. She verbalized thanks and understanding.

## 2024-12-06 NOTE — Telephone Encounter (Signed)
-----   Message from Nurse Porsche C sent at 12/06/2024 12:39 PM EST ----- Pt wants phone visit to talk about radioactive seed and clip placement. I can only tell her what I've read in the surgeon notes. Breast center told her that they didn't know and I read her what's in the OP notes. She wants a clarification as well has her MRI results. Which I told her were negative for malignancy.

## 2024-12-06 NOTE — Telephone Encounter (Signed)
 Attempted to call pt to offer MD phone visit 12/23/24 to further discuss MRI results and seed placement concerns. LVM for call back if 12/23/24 at 0815 does not work for her. This has been scheduled.

## 2024-12-07 ENCOUNTER — Telehealth: Payer: Self-pay | Admitting: Hematology and Oncology

## 2024-12-07 NOTE — Telephone Encounter (Signed)
 Telephone call I informed the patient of the breast MRI is normal. She had questions about whether or not she had a clip and seed still in place.  I informed her that they had been removed with surgery. We are doing contrast mammograms in June and follow-up after that.

## 2024-12-23 ENCOUNTER — Inpatient Hospital Stay: Admitting: Hematology and Oncology

## 2025-06-02 ENCOUNTER — Other Ambulatory Visit (HOSPITAL_BASED_OUTPATIENT_CLINIC_OR_DEPARTMENT_OTHER)

## 2025-06-09 ENCOUNTER — Inpatient Hospital Stay: Admitting: Hematology and Oncology

## 2025-06-16 ENCOUNTER — Inpatient Hospital Stay: Admitting: Hematology and Oncology

## 2025-10-05 ENCOUNTER — Ambulatory Visit (HOSPITAL_BASED_OUTPATIENT_CLINIC_OR_DEPARTMENT_OTHER): Admitting: Obstetrics & Gynecology
